# Patient Record
Sex: Male | Born: 1945
Health system: Southern US, Community
[De-identification: ages and names within clinical notes are randomized; demographics above are authoritative.]

## PROBLEM LIST (undated history)

## (undated) DIAGNOSIS — I10 Essential (primary) hypertension: Secondary | ICD-10-CM

## (undated) DIAGNOSIS — I2510 Atherosclerotic heart disease of native coronary artery without angina pectoris: Secondary | ICD-10-CM

## (undated) DIAGNOSIS — I4819 Other persistent atrial fibrillation: Secondary | ICD-10-CM

## (undated) DIAGNOSIS — N400 Enlarged prostate without lower urinary tract symptoms: Secondary | ICD-10-CM

## (undated) DIAGNOSIS — I4892 Unspecified atrial flutter: Secondary | ICD-10-CM

## (undated) DIAGNOSIS — G4730 Sleep apnea, unspecified: Secondary | ICD-10-CM

## (undated) DIAGNOSIS — K279 Peptic ulcer, site unspecified, unspecified as acute or chronic, without hemorrhage or perforation: Secondary | ICD-10-CM

## (undated) DIAGNOSIS — E7800 Pure hypercholesterolemia, unspecified: Secondary | ICD-10-CM

## (undated) HISTORY — DX: Peptic ulcer, site unspecified, unspecified as acute or chronic, without hemorrhage or perforation: K27.9

## (undated) HISTORY — DX: Benign prostatic hyperplasia without lower urinary tract symptoms: N40.0

## (undated) HISTORY — PX: APPENDECTOMY: SHX54

## (undated) HISTORY — DX: Sleep apnea, unspecified: G47.30

## (undated) HISTORY — DX: Pure hypercholesterolemia, unspecified: E78.00

## (undated) HISTORY — DX: Other persistent atrial fibrillation: I48.19

## (undated) HISTORY — DX: Atherosclerotic heart disease of native coronary artery without angina pectoris: I25.10

## (undated) HISTORY — DX: Unspecified atrial flutter: I48.92

## (undated) HISTORY — DX: Essential (primary) hypertension: I10

---

## 1998-07-04 HISTORY — PX: HERNIA REPAIR: SHX51

## 1999-08-31 ENCOUNTER — Ambulatory Visit (HOSPITAL_BASED_OUTPATIENT_CLINIC_OR_DEPARTMENT_OTHER): Admission: RE | Admit: 1999-08-31 | Discharge: 1999-09-01 | Payer: Self-pay | Admitting: Surgery

## 2003-10-29 DIAGNOSIS — C4492 Squamous cell carcinoma of skin, unspecified: Secondary | ICD-10-CM

## 2003-10-29 HISTORY — DX: Squamous cell carcinoma of skin, unspecified: C44.92

## 2004-09-23 ENCOUNTER — Emergency Department (HOSPITAL_COMMUNITY): Admission: EM | Admit: 2004-09-23 | Discharge: 2004-09-23 | Payer: Self-pay | Admitting: Family Medicine

## 2005-01-20 ENCOUNTER — Ambulatory Visit (HOSPITAL_BASED_OUTPATIENT_CLINIC_OR_DEPARTMENT_OTHER): Admission: RE | Admit: 2005-01-20 | Discharge: 2005-01-20 | Payer: Self-pay | Admitting: Family Medicine

## 2005-01-23 ENCOUNTER — Ambulatory Visit: Payer: Self-pay | Admitting: Internal Medicine

## 2006-05-12 ENCOUNTER — Inpatient Hospital Stay (HOSPITAL_COMMUNITY): Admission: AC | Admit: 2006-05-12 | Discharge: 2006-05-16 | Payer: Self-pay

## 2006-05-31 ENCOUNTER — Ambulatory Visit (HOSPITAL_COMMUNITY): Admission: RE | Admit: 2006-05-31 | Discharge: 2006-05-31 | Payer: Self-pay | Admitting: Neurosurgery

## 2006-07-07 ENCOUNTER — Ambulatory Visit (HOSPITAL_COMMUNITY): Admission: RE | Admit: 2006-07-07 | Discharge: 2006-07-07 | Payer: Self-pay | Admitting: Neurosurgery

## 2008-09-03 ENCOUNTER — Inpatient Hospital Stay (HOSPITAL_COMMUNITY): Admission: AD | Admit: 2008-09-03 | Discharge: 2008-09-04 | Payer: Self-pay | Admitting: Interventional Cardiology

## 2009-10-30 ENCOUNTER — Encounter: Payer: Self-pay | Admitting: Internal Medicine

## 2009-11-03 ENCOUNTER — Encounter: Payer: Self-pay | Admitting: Internal Medicine

## 2010-04-19 ENCOUNTER — Encounter: Payer: Self-pay | Admitting: Internal Medicine

## 2010-05-03 ENCOUNTER — Encounter: Payer: Self-pay | Admitting: Internal Medicine

## 2010-06-15 DIAGNOSIS — E782 Mixed hyperlipidemia: Secondary | ICD-10-CM

## 2010-06-15 DIAGNOSIS — I4892 Unspecified atrial flutter: Secondary | ICD-10-CM

## 2010-06-15 DIAGNOSIS — I2510 Atherosclerotic heart disease of native coronary artery without angina pectoris: Secondary | ICD-10-CM | POA: Insufficient documentation

## 2010-06-15 DIAGNOSIS — I10 Essential (primary) hypertension: Secondary | ICD-10-CM

## 2010-06-15 DIAGNOSIS — E785 Hyperlipidemia, unspecified: Secondary | ICD-10-CM

## 2010-06-16 ENCOUNTER — Ambulatory Visit: Payer: Self-pay | Admitting: Internal Medicine

## 2010-06-16 ENCOUNTER — Encounter: Payer: Self-pay | Admitting: Internal Medicine

## 2010-06-16 DIAGNOSIS — G4733 Obstructive sleep apnea (adult) (pediatric): Secondary | ICD-10-CM | POA: Insufficient documentation

## 2010-07-25 ENCOUNTER — Encounter: Payer: Self-pay | Admitting: Neurosurgery

## 2010-08-05 NOTE — Letter (Signed)
Summary: Arthur Gardner Physicians Office Visit Note   [facility] Physicians Office Visit Note   Imported By: Roderic Ovens 07/01/2010 13:48:43  _____________________________________________________________________  External Attachment:    Type:   Image     Comment:   External Document

## 2010-08-05 NOTE — Assessment & Plan Note (Signed)
Summary: nep/a-fib   Visit Type:  Initial Consult Referring Arthur Gardner:  Dr Eldridge Dace Primary Arthur Gardner:  Arthur Nora, MD   History of Present Illness: [name] is a pleasant 65 yo WM with a h/o CAD who presents for further evaluation of atrial arrhythmias.  He reports onset of palpitations 15 -20 years ago.  He describes irregular palpitations lasting 5 minutes to 8 hours.  Episodes occasional occur during the night.  He is unaware of triggers/ precipitants, though often stress will bring on an episode.  Episodes occur every 3-4 months.  He reports noticing a regular "heart pounding" which will sometimes wake him from sleeping.  He denies chest pain, SOB, dizziness, presyncope, or syncope.  Recently, he had a stress test during which atrial fibrillation and atrial flutter were captured.  He was placed on metoprolol as needed.  He has not had further episodes since that time.  Allergies (verified): 1)  ! Steroids  Past History:  Past Medical History:  1. Coronary artery disease status post drug-eluting stent to the left anterior descending.  2/10  2. Atrial flutter/ atrial fibrillation  3. History of peptic ulcer disease (remote).   4. Hypercholesterolemia     5. Hypertension in the past.   6. Benign prostatic hypertrophy.   7. Sleep apnea, not using CPAP.   8. Bilateral hearing loss, wears hearing aids.   9. Status post appendectomy.   10.Status post bilateral hernia repair in 2000.  Past Surgical History:  Status post appendectomy.   Status post bilateral hernia repair in 2000.  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   Vital Signs:  Patient profile:   65 year old male Height:      70 inches Weight:      191 pounds BMI:     27.50 Pulse rate:   59 / minute BP sitting:   104 / 80  (left  arm)  Vitals Entered By: Laurance Flatten CMA (June 16, 2010 9:04 AM)  Physical Exam  General:  Well developed, well nourished, in no acute distress. Head:  normocephalic and atraumatic Eyes:  PERRLA/EOM intact; conjunctiva and lids normal. Mouth:  Teeth, gums and palate normal. Oral mucosa normal. Neck:  Neck supple, no JVD. No masses, thyromegaly or abnormal cervical nodes. Lungs:  Clear bilaterally to auscultation and percussion. Heart:  Non-displaced PMI, chest non-tender; regular rate and rhythm, S1, S2 without murmurs, rubs or gallops. Carotid upstroke normal, no bruit. Normal abdominal aortic size, no bruits. Femorals normal pulses, no bruits. Pedals normal pulses. No edema, no varicosities. Abdomen:  Bowel sounds positive; abdomen soft and non-tender without masses, organomegaly, or hernias noted. No hepatosplenomegaly. Msk:  Back normal, normal gait. Muscle strength and tone normal. Pulses:  pulses normal in all 4 extremities Extremities:  No clubbing or cyanosis. Neurologic:  Alert and oriented x 3. Skin:  Intact without lesions or rashes. Psych:  Normal affect.   Nuclear Study  Procedure date:  11/03/2009  Findings:      Iimpression: The post stresss left ventricle function is normal. The post-stress EF is 57%. Exercise capacity 10.0 METS.  Superventricular arrhythmia noted during recovery. Possible atrial flutter. Will get EP oponion regarding strips. Normal myocardial perfusion scan demonstrating an attenuation atifact in the inferior region of the myocardium. No ischemia or infarct/scan is  seen in remaining myocardium.  Lance Muss, MD [facility]    EKG  Procedure date:  06/16/2010  Findings:      sinus bradycardia 59 bpm, PR 168, QRS 102, Qtc 400, LAD, otherwise normal ekg  Impression & Recommendations:  Problem # 1:  ATRIAL FLUTTER (ICD-427.32) [name] is a pleasant 65 yo WM with a h/o irregular palpitations who presents today for EP consultation regarding  atrial arrhythmia recently observed during an exercise stress test.  Though he reports longstanding palpitations, their infrequent nature has made clear documentation and characterization of his arrhythmias difficult. I have reviewed his recent GXT electrocardiograms in detail.  The tracings reveal initially sinus rhythm with exercise.  During stage 3, he begins having short salvos of afib.  The study is terminated and he develops sustained afib with RVR initially with recovery.  After 1 minute of recovery, atrial fibrillation organizes into atrial flutter (typical appearing) with 2:1 AV conduction.  He remains in atrial flutter for several minutes with intermittent 4:1 AV conduction.  Finally, at 12 minutes of recovery, he returns to sinus rhythm. This demonstrates, that his initial arrhythmia was atrial fibrillation with subsequent organization into typical flutter before spontaneous termination.  Therapeutic strategies for afib and atrial flutter including medicine and ablation were discussed in detail with the patient today. Risk, benefits, and alternatives to EP study and radiofrequency ablation were also discussed in detail today. It remains unclear as to whether the majority of his clinical symptoms are due to afib or atrial flutter.  At this point, I would recommend medical therapy.  Given his h/o coronary disease, I woud not recommend Ic medicines.  I would consider multaq as an initial antiarrhythmic medicine for this patient.  Presently, he states that episodes are infrequent and therefore declines daily antiarrhythmic medicine.  He will continue to take metoprolol for rate control.  His CHADs2 score is 1.  He prefers aspirin over coumadin at this time. Compliance with CPAP was encouraged. He will need an echocardiogram by [facility] to evaluate LA size and exclude valvular disease.  Problem # 2:  CAD (ICD-414.00) stable, without symptoms of ischemia no changes  Problem # 3:   HYPERTENSION, BENIGN (ICD-401.1) stable His updated medication list for this problem includes:    Lisinopril 5 Mg Tabs (Lisinopril) .Marland Kitchen... Take one tablet by mouth daily    Aspirin Ec 325 Mg Tbec (Aspirin) .Marland Kitchen... Take one tablet by mouth daily  Problem # 4:  MIXED HYPERLIPIDEMIA (ICD-272.2) stable His updated medication list for this problem includes:    Simvastatin 20 Mg Tabs (Simvastatin) .Marland Kitchen... Take one tablet by mouth daily at bedtime  Problem # 5:  OBSTRUCTIVE SLEEP APNEA (ICD-327.23) the importance of compliance with CPAP was advised.  Patient Instructions: 1)  Your physician recommends that you schedule a follow-up appointment in: 3 months with Dr Johney Frame 2)  Your physician recommends that you continue on your current medications as directed. Please refer to the Current Medication list given to you today.

## 2010-08-06 ENCOUNTER — Encounter: Payer: Self-pay | Admitting: Internal Medicine

## 2010-08-06 ENCOUNTER — Telehealth: Payer: Self-pay | Admitting: Internal Medicine

## 2010-08-11 NOTE — Progress Notes (Signed)
Summary: re ekg  Phone Note Call from Patient Call back at Home Phone [phone]   Caller: Patient Reason for Call: Talk to Nurse Summary of Call: pt states dr. Johney Frame told him to call you re an ekg. pt only wants to talk kelly.  Initial call taken by: Roe Coombs,  August 06, 2010 12:16 PM  Follow-up for Phone Call        called pt and he is coming in for EKG now Dennis Bast, RN, BSN  August 06, 2010 12:55 PM pt came in for EKG and he is in afib.  HR controlled with a HR of 88-96.  Ok to take an additional Metoprolol 25mg  at this time.  His normal resting HR is in the 50's.  I also explained to him that he could take Multaq as Dr Johney Frame had discussed with him  He is opposed to this as he wants to get off of some of his medications He is going to talk with his primary cardiologist regarding that.  Will continue his meds as directed for know and call us back if his symptoms get worse. Dennis Bast, RN, BSN  August 06, 2010 2:18 PM Follow-up by: Hillis Range, MD,  August 06, 2010 5:47 PM  Additional Follow-up for Phone Call Additional follow up Details #1::        agree Additional Follow-up by: Hillis Range, MD,  August 06, 2010 5:48 PM

## 2010-09-14 ENCOUNTER — Encounter: Payer: Self-pay | Admitting: Internal Medicine

## 2010-09-15 ENCOUNTER — Ambulatory Visit (INDEPENDENT_AMBULATORY_CARE_PROVIDER_SITE_OTHER): Payer: Medicare Other | Admitting: Internal Medicine

## 2010-09-15 ENCOUNTER — Encounter: Payer: Self-pay | Admitting: Internal Medicine

## 2010-09-15 DIAGNOSIS — I4891 Unspecified atrial fibrillation: Secondary | ICD-10-CM | POA: Insufficient documentation

## 2010-09-15 DIAGNOSIS — I4892 Unspecified atrial flutter: Secondary | ICD-10-CM

## 2010-09-15 DIAGNOSIS — I2510 Atherosclerotic heart disease of native coronary artery without angina pectoris: Secondary | ICD-10-CM

## 2010-09-21 NOTE — Assessment & Plan Note (Signed)
Summary: 3 month fu appt rs from bumplist/mt   Visit Type:  Follow-up Referring Provider:  Dr Eldridge Dace Primary Provider:  [name], MD   History of Present Illness: [name] is a pleasant 65 yo WM with a h/o CAD, atrial fibrillation, and atrial flutter who presents for further EP follow-up.  He reports doing well since last being seen in our office.  He is unaware of significant symptoms of arrhythmias.  In early february, he did have a day where he noticed palpitations with mild fatigue.  He presented to our office and was found to have rate controlled afib.  He declined antiarrhythmic medicine at that time.  He is unaware of any subsequent afib.  He is otherwise without complaint today.  He denies CP, SOB, dizziness, presyncope, or syncope.  Current Medications (verified): 1)  Plavix 75 Mg Tabs (Clopidogrel Bisulfate) .... Take One Tablet By Mouth Daily 2)  Lisinopril 5 Mg Tabs (Lisinopril) .... Take One Tablet By Mouth Daily 3)  Simvastatin 20 Mg Tabs (Simvastatin) .... Take One Tablet By Mouth Daily At Bedtime 4)  Metoprolol Tartrate 25 Mg Tabs (Metoprolol Tartrate) .... Take One Tablet By Mouth Once Daily 5)  Fish Oil   Oil (Fish Oil) .... Two Times A Day 6)  Co Q-10 .... Once Daily 7)  Glucosamine-Chondroitin   Caps (Glucosamine-Chondroit-Vit C-Mn) .... Two Times A Day 8)  Aspirin Ec 325 Mg Tbec (Aspirin) .... Take One Tablet By Mouth Daily 9)  Calcium-Magnesium-Zinc 333-133-5 Mg Tabs (Calcium-Magnesium-Zinc) .... Two Times A Day  Allergies: 1)  ! Steroids  Past History:  Past Medical History: Reviewed history from 06/16/2010 and no changes required.  1. Coronary artery disease status post drug-eluting stent to the left anterior descending.  2/10  2. Atrial flutter/ atrial fibrillation  3. History of peptic ulcer disease (remote).   4. Hypercholesterolemia     5. Hypertension in the past.   6. Benign prostatic hypertrophy.   7. Sleep apnea, not using CPAP.   8. Bilateral  hearing loss, wears hearing aids.   9. Status post appendectomy.   10.Status post bilateral hernia repair in 2000.  Past Surgical History: Reviewed history from 06/16/2010 and no changes required.  Status post appendectomy.   Status post bilateral hernia repair in 2000.  Social History: Reviewed history from 06/16/2010 and no changes required. 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   Vital Signs:  Patient profile:   65 year old male Height:      70 inches Weight:      195 pounds BMI:     28.08 Pulse rate:   56 / minute BP sitting:   110 / 72  (left arm)  Vitals Entered By: Laurance Flatten CMA (September 15, 2010 9:15 AM)  Physical Exam  General:  Well developed, well nourished, in no acute distress. Head:  normocephalic and atraumatic Eyes:  PERRLA/EOM intact; conjunctiva and lids normal. Mouth:  Teeth, gums and palate normal. Oral mucosa normal. Neck:  Neck supple, no JVD. No masses, thyromegaly or abnormal cervical nodes. Lungs:  Clear bilaterally to auscultation and percussion. Heart:  Non-displaced PMI, chest non-tender; regular rate and rhythm, S1, S2 without murmurs, rubs or gallops. Carotid upstroke normal, no bruit. Normal abdominal aortic size, no bruits. Femorals normal pulses, no bruits. Pedals normal pulses. No edema, no varicosities. Abdomen:  Bowel  sounds positive; abdomen soft and non-tender without masses, organomegaly, or hernias noted. No hepatosplenomegaly. Msk:  Back normal, normal gait. Muscle strength and tone normal. Extremities:  No clubbing or cyanosis. Neurologic:  Alert and oriented x 3.   EKG  Procedure date:  08/06/2010  Findings:      afib, V rates 80s  Impression & Recommendations:  Problem # 1:  ATRIAL FLUTTER (ICD-427.32) [name] is a pleasant 65 yo WM with a h/o irregular palpitations who presents today for EP  consultation regarding atrial arrhythmia recently observed during an exercise stress test.   He has been documented to have both afib and atrial flutter.  With metoprolol, his recent afib was well rate controlled.  He reports minimal sypmtoms with arrhythmias and is clear that he wishes to decline AAD at this time.  He will continue to take metoprolol for rate control.  His CHADs2 score is 1.  He prefers aspirin over coumadin at this time. Compliance with CPAP was encouraged. He will follow-up with Dr Eldridge Dace and I will see him as needed.  Problem # 2:  ATRIAL FIBRILLATION (ICD-427.31) as above  Problem # 3:  CORONARY ATHEROSCLEROSIS NATIVE CORONARY ARTERY (ICD-414.01) no ischemic sypmtoms no changes  Problem # 4:  HYPERTENSION, BENIGN (ICD-401.1) stable  Patient Instructions: 1)  Your physician recommends that you schedule a follow-up appointment as needed with Dr Johney Frame 2)  Your physician recommends that you continue on your current medications as directed. Please refer to the Current Medication list given to you today.

## 2010-10-14 LAB — CBC
MCHC: 35.2 g/dL (ref 30.0–36.0)
MCV: 90.4 fL (ref 78.0–100.0)
RDW: 12.5 % (ref 11.5–15.5)

## 2010-10-14 LAB — BASIC METABOLIC PANEL
BUN: 8 mg/dL (ref 6–23)
CO2: 29 mEq/L (ref 19–32)
Calcium: 8.7 mg/dL (ref 8.4–10.5)
Chloride: 104 mEq/L (ref 96–112)
Creatinine, Ser: 0.88 mg/dL (ref 0.4–1.5)
Glucose, Bld: 107 mg/dL — ABNORMAL HIGH (ref 70–99)

## 2010-11-16 NOTE — Discharge Summary (Signed)
Arthur Gardner, Arthur Gardner                   ACCOUNT NO.:  [id number]   MEDICAL RECORD NO.:  [id number]          PATIENT TYPE:  INP   LOCATION:  2502                         FACILITY:  [facility]   PHYSICIAN:  Corky Crafts, MDDATE OF BIRTH:  1946/04/30   DATE OF ADMISSION:  09/03/2008  DATE OF DISCHARGE:                               DISCHARGE SUMMARY   DISCHARGE DIAGNOSES:  1. Coronary artery disease status post drug-eluting stent to the left      anterior descending.  2. Chest pain, resolved.  3. History of peptic ulcer disease.  4. Hypercholesterolemia on over-the-counter medications for treatment.  5. History of hypertension in the past.  6. Benign prostatic hypertrophy.  7. Sleep apnea, not using CPAP.  8. Bilateral hearing loss, wears hearing aids.  9. Status post appendectomy.  10.Status post bilateral hernia repair in 2000.   HOSPITAL COURSE:  Arthur Gardner is a 65 year old patient who is referred by  Dr. Johny Blamer for complaints of intermittent chest pain.  Dr.  Eldridge Dace saw the patient and felt that his symptoms were worrisome for  underlying heart disease.  His EKG showed septal Q-waves with minimal ST  segment elevation, ST-T wave changes in lead III, T-wave inversion in  lead IV.  This EKG was markedly changed from October 2009.  For this  reason, he felt cardiac catheterization was warranted.   On September 03, 2008, the patient was brought into the hospital for left  heart catheterization.  He was found to have a 99% proximal LAD lesion  with TIMI 2 flow.  No other real blockages.  LV gram showed anterior  apical hypokinesis with an EF of 35%-40%.  An Endeavor stent was placed  to the proximal LAD lesion with good result.  The patient is to remain  on aspirin and Plavix indefinitely.  On discharge date, his labs showed  hemoglobin of 14.7, hematocrit 41.8, white count 6.4, platelets 136,  sodium 137, potassium 4.5, BUN 8, and creatinine 0.88.   He was felt to be ready for  discharge.  He was discharged to home in  stable and improved condition.   DISCHARGE MEDICATIONS:  1. Plavix 75 mg a day.  2. Enteric-coated aspirin 325 mg a day.  3. Osteo Bi-Flex as directed.  4. Fish oil as before.  5. Saw palmetto as directed.  6. Red yeast rice as directed.  7. Calcium daily.  8. Sublingual nitroglycerin p.r.n., chest pain.   The patient is to remain on a low-sodium heart-healthy diet.  Clean cath  site gently with soap and water, no scrubbing.  Increase activity  slowly.  No lifting over 10 pounds for 1 week, no driving for 2 days.  Follow up with Dr. Eldridge Dace on September 19, 2008, at 11:30 a.m.      Guy Franco, P.A.      Corky Crafts, MD  Electronically Signed    LB/MEDQ  D:  09/04/2008  T:  09/04/2008  Job:  [id number]   cc:   Melida Quitter, M.D.

## 2010-11-19 NOTE — Procedures (Signed)
Arthur Gardner, Arthur Gardner                   ACCOUNT NO.:  [id number]   MEDICAL RECORD NO.:  [id number]          PATIENT TYPE:  OUT   LOCATION:  SLEEP CENTER                 FACILITY[facility]  [facility]   PHYSICIAN:  Clinton D. Maple Hudson, M.D. DATE OF BIRTH:  [birth year]   DATE OF STUDY:  01/20/2005                              NOCTURNAL POLYSOMNOGRAM   REFERRING PHYSICIAN:  Noberto Retort, M.D.   INDICATION FOR STUDY:  Hypersomnia with sleep apnea.   EPWORTH SLEEPINESS SCORE:  4/24   BMI:  25   WEIGHT:  175 pounds   SLEEP ARCHITECTURE:  Total sleep time 369 minutes with sleep efficiency 82%.  Stage I 8%, stage III 57%, stages III and IV 17%.  REM 18% of total sleep  time.  Sleep latency 18 minutes.  REM latency 85 minutes.  Awake after sleep  onset 65 minutes.  Arousal index 17.  No bedtime medications taken.   RESPIRATORY DATA:  Split study protocol.  Respiratory disturbance index  (RDI, AHI) 38.5 obstructive events per hour indicating moderate obstructive  sleep apnea/hypopnea syndrome before CPAP.  There were 65 obstructive apneas  and 20 hypopneas before CPAP.  Most events were recorded while sleeping  supine.  REM RDI 12.5.  CPAP was titrated to 8 CWP, RDI 8.4 per hour, using  a small ResMed full face mask.   OXYGEN DATA:  Moderate snoring with oxygen desaturations to a nadir of 55%  before CPAP.  After CPAP control, oxygen saturation held 94-98% on room air.   CARDIAC DATA:  Normal sinus rhythm.   MOVEMENT/PARASOMNIA:  Occasional leg jerks with little sleep disturbance.   IMPRESSION/RECOMMENDATION:  1.  Moderate obstructive sleep apnea/hypopnea syndrome, respiratory      disturbance index  38.5 per hour with moderate snoring and oxygen      desaturation to 55%.  2.  Successful CPAP titration to 8 CWP using a small ResMed full face mask      for an respiratory disturbance index      of 8.4.  Consider home trial at 9 CWP in an effort to reduce the      respiratory disturbance index into the  desired range of 0-5 per hour.      Clinton D. Maple Hudson, M.D.  Diplomat    CDY/MEDQ  D:  01/23/2005 13:33:17  T:  01/24/2005 09:30:45  Job:  [id number]

## 2010-11-19 NOTE — Op Note (Signed)
[place]  Patient:    Arthur Gardner, Arthur Gardner                          MRN: [id number] Adm. Date:  [id number] Attending:  Katha Cabal CC:         Willis Modena. Dreiling, M.D.                           Operative Report  CCS NUMBER:  [id number]  PREOPERATIVE DIAGNOSIS:  Bilateral inguinal hernias.  POSTOPERATIVE DIAGNOSIS:  Bilateral direct inguinal hernias with the right side  larger than the left.  PROCEDURE:  Laparoscopic bilateral inguinal herniorrhaphy.  SURGEON:  Thornton Park. Daphine Deutscher, M.D.  ASSISTANT:  Sharlet Salina T. Hoxworth, M.D.  ANESTHESIA:  General endotracheal.  DESCRIPTION OF PROCEDURE:  The patient was taken to OR #3 on February 27 and given general anesthesia.  He received Ancef, 1 g.  After I clipped him, I noted a little pimple in his left lower quadrant in the inguinal region and, after prepping with Betadine, I put a little Tegaderm to isolate that area.  This was not going to e in our way.  He was receiving Ancef preoperatively.  A longitudinal incision was made down below his umbilicus and I slipped off to the right side of the midline, where I incised the rectus sheath and then did a blunt dissection with my finger and then inserted a balloon.  Balloon dissection was performed with the scope in place and got a good dissection.  The patient had a previous appendectomy incision. Nevertheless, I got a good dissection with the balloon.  It was apparent that he had bilateral direct hernias.  I went ahead, though, and dissected free the right cord and the left cord and looked at these and saw no indirect component.  The large right direct hernia was noted and had all the fat taken out of it.  The left side was similarly noted.  I cut a piece of mesh 4 x 6 from a piece of 6 x 6 mesh and placed it on the right side first, taking it along Coopers ligament down with multiple staples down out to near the femoral vein.  I then tacked  anteriorly and all around the defect and then tacked it laterally to hold it in place.  I did he same thing on the left side.  They did overlap slightly.  I could always palpate the hernia tacker anteriorly and did not tack below the inguinal ligament.  No bleeding was seen.  I then deflated the abdomen and removed the trocars. Then, 4-0 Vicryl was used subcuticularly and the skin was closed with benzoin and Steri-Strips.  The patient will be given Mepergan Fortis to take for pain at home and will be given Keflex to take twice a day for five days.  However, prior to discharge, he is going to spend overnight for observation at [facility]. DD:  08/31/99 TD:  08/31/99 Job: [id number]

## 2010-12-13 ENCOUNTER — Encounter: Payer: Self-pay | Admitting: Internal Medicine

## 2011-07-11 DIAGNOSIS — G4737 Central sleep apnea in conditions classified elsewhere: Secondary | ICD-10-CM | POA: Diagnosis not present

## 2011-07-11 DIAGNOSIS — G4733 Obstructive sleep apnea (adult) (pediatric): Secondary | ICD-10-CM | POA: Diagnosis not present

## 2011-11-14 DIAGNOSIS — I10 Essential (primary) hypertension: Secondary | ICD-10-CM | POA: Diagnosis not present

## 2011-11-14 DIAGNOSIS — E782 Mixed hyperlipidemia: Secondary | ICD-10-CM | POA: Diagnosis not present

## 2011-11-14 DIAGNOSIS — Z125 Encounter for screening for malignant neoplasm of prostate: Secondary | ICD-10-CM | POA: Diagnosis not present

## 2011-11-14 DIAGNOSIS — I2510 Atherosclerotic heart disease of native coronary artery without angina pectoris: Secondary | ICD-10-CM | POA: Diagnosis not present

## 2011-11-21 DIAGNOSIS — H40019 Open angle with borderline findings, low risk, unspecified eye: Secondary | ICD-10-CM | POA: Diagnosis not present

## 2011-11-21 DIAGNOSIS — H11009 Unspecified pterygium of unspecified eye: Secondary | ICD-10-CM | POA: Diagnosis not present

## 2011-11-21 DIAGNOSIS — H43399 Other vitreous opacities, unspecified eye: Secondary | ICD-10-CM | POA: Diagnosis not present

## 2011-11-21 DIAGNOSIS — H04129 Dry eye syndrome of unspecified lacrimal gland: Secondary | ICD-10-CM | POA: Diagnosis not present

## 2011-11-21 DIAGNOSIS — H2510 Age-related nuclear cataract, unspecified eye: Secondary | ICD-10-CM | POA: Diagnosis not present

## 2011-12-13 DIAGNOSIS — H11009 Unspecified pterygium of unspecified eye: Secondary | ICD-10-CM | POA: Diagnosis not present

## 2011-12-13 DIAGNOSIS — H01009 Unspecified blepharitis unspecified eye, unspecified eyelid: Secondary | ICD-10-CM | POA: Diagnosis not present

## 2011-12-13 DIAGNOSIS — L719 Rosacea, unspecified: Secondary | ICD-10-CM | POA: Diagnosis not present

## 2012-07-24 DIAGNOSIS — G4733 Obstructive sleep apnea (adult) (pediatric): Secondary | ICD-10-CM | POA: Diagnosis not present

## 2012-08-14 DIAGNOSIS — D0439 Carcinoma in situ of skin of other parts of face: Secondary | ICD-10-CM | POA: Diagnosis not present

## 2012-08-14 DIAGNOSIS — L570 Actinic keratosis: Secondary | ICD-10-CM | POA: Diagnosis not present

## 2012-08-14 DIAGNOSIS — D485 Neoplasm of uncertain behavior of skin: Secondary | ICD-10-CM | POA: Diagnosis not present

## 2012-09-06 DIAGNOSIS — D0439 Carcinoma in situ of skin of other parts of face: Secondary | ICD-10-CM | POA: Diagnosis not present

## 2012-09-06 DIAGNOSIS — L570 Actinic keratosis: Secondary | ICD-10-CM | POA: Diagnosis not present

## 2012-11-30 DIAGNOSIS — E782 Mixed hyperlipidemia: Secondary | ICD-10-CM | POA: Diagnosis not present

## 2012-11-30 DIAGNOSIS — I059 Rheumatic mitral valve disease, unspecified: Secondary | ICD-10-CM | POA: Diagnosis not present

## 2012-11-30 DIAGNOSIS — I10 Essential (primary) hypertension: Secondary | ICD-10-CM | POA: Diagnosis not present

## 2012-11-30 DIAGNOSIS — I2510 Atherosclerotic heart disease of native coronary artery without angina pectoris: Secondary | ICD-10-CM | POA: Diagnosis not present

## 2012-12-17 DIAGNOSIS — Z0000 Encounter for general adult medical examination without abnormal findings: Secondary | ICD-10-CM | POA: Diagnosis not present

## 2012-12-17 DIAGNOSIS — E785 Hyperlipidemia, unspecified: Secondary | ICD-10-CM | POA: Diagnosis not present

## 2012-12-17 DIAGNOSIS — Z125 Encounter for screening for malignant neoplasm of prostate: Secondary | ICD-10-CM | POA: Diagnosis not present

## 2012-12-17 DIAGNOSIS — E875 Hyperkalemia: Secondary | ICD-10-CM | POA: Diagnosis not present

## 2012-12-17 DIAGNOSIS — I10 Essential (primary) hypertension: Secondary | ICD-10-CM | POA: Diagnosis not present

## 2012-12-21 DIAGNOSIS — I059 Rheumatic mitral valve disease, unspecified: Secondary | ICD-10-CM | POA: Diagnosis not present

## 2012-12-31 DIAGNOSIS — H2510 Age-related nuclear cataract, unspecified eye: Secondary | ICD-10-CM | POA: Diagnosis not present

## 2012-12-31 DIAGNOSIS — H40019 Open angle with borderline findings, low risk, unspecified eye: Secondary | ICD-10-CM | POA: Diagnosis not present

## 2012-12-31 DIAGNOSIS — H04129 Dry eye syndrome of unspecified lacrimal gland: Secondary | ICD-10-CM | POA: Diagnosis not present

## 2013-01-15 DIAGNOSIS — L570 Actinic keratosis: Secondary | ICD-10-CM | POA: Diagnosis not present

## 2013-03-11 DIAGNOSIS — Z79899 Other long term (current) drug therapy: Secondary | ICD-10-CM | POA: Diagnosis not present

## 2013-03-11 DIAGNOSIS — E7800 Pure hypercholesterolemia, unspecified: Secondary | ICD-10-CM | POA: Diagnosis not present

## 2013-05-24 DIAGNOSIS — H2510 Age-related nuclear cataract, unspecified eye: Secondary | ICD-10-CM | POA: Diagnosis not present

## 2013-05-24 DIAGNOSIS — H11009 Unspecified pterygium of unspecified eye: Secondary | ICD-10-CM | POA: Diagnosis not present

## 2013-05-24 DIAGNOSIS — L719 Rosacea, unspecified: Secondary | ICD-10-CM | POA: Diagnosis not present

## 2013-05-24 DIAGNOSIS — H179 Unspecified corneal scar and opacity: Secondary | ICD-10-CM | POA: Diagnosis not present

## 2013-05-24 DIAGNOSIS — H40019 Open angle with borderline findings, low risk, unspecified eye: Secondary | ICD-10-CM | POA: Diagnosis not present

## 2013-06-18 DIAGNOSIS — L723 Sebaceous cyst: Secondary | ICD-10-CM | POA: Diagnosis not present

## 2013-07-29 DIAGNOSIS — G4733 Obstructive sleep apnea (adult) (pediatric): Secondary | ICD-10-CM | POA: Diagnosis not present

## 2013-08-02 DIAGNOSIS — L570 Actinic keratosis: Secondary | ICD-10-CM | POA: Diagnosis not present

## 2013-08-02 DIAGNOSIS — D0439 Carcinoma in situ of skin of other parts of face: Secondary | ICD-10-CM | POA: Diagnosis not present

## 2013-08-02 DIAGNOSIS — D0430 Carcinoma in situ of skin of unspecified part of face: Secondary | ICD-10-CM | POA: Diagnosis not present

## 2013-08-14 ENCOUNTER — Other Ambulatory Visit: Payer: Self-pay | Admitting: Interventional Cardiology

## 2013-08-26 DIAGNOSIS — B029 Zoster without complications: Secondary | ICD-10-CM | POA: Diagnosis not present

## 2013-09-27 ENCOUNTER — Encounter: Payer: Self-pay | Admitting: Interventional Cardiology

## 2013-09-30 DIAGNOSIS — L570 Actinic keratosis: Secondary | ICD-10-CM | POA: Diagnosis not present

## 2013-10-29 DIAGNOSIS — C4442 Squamous cell carcinoma of skin of scalp and neck: Secondary | ICD-10-CM | POA: Diagnosis not present

## 2013-10-29 DIAGNOSIS — L570 Actinic keratosis: Secondary | ICD-10-CM | POA: Diagnosis not present

## 2013-10-29 DIAGNOSIS — D485 Neoplasm of uncertain behavior of skin: Secondary | ICD-10-CM | POA: Diagnosis not present

## 2013-11-12 ENCOUNTER — Ambulatory Visit: Payer: Medicare Other | Admitting: Interventional Cardiology

## 2013-11-29 ENCOUNTER — Ambulatory Visit: Payer: Medicare Other | Admitting: Interventional Cardiology

## 2013-12-04 ENCOUNTER — Ambulatory Visit (INDEPENDENT_AMBULATORY_CARE_PROVIDER_SITE_OTHER): Payer: Medicare Other | Admitting: Interventional Cardiology

## 2013-12-04 ENCOUNTER — Encounter: Payer: Self-pay | Admitting: Interventional Cardiology

## 2013-12-04 VITALS — BP 120/78 | HR 45 | Ht 70.0 in | Wt 204.0 lb

## 2013-12-04 DIAGNOSIS — E785 Hyperlipidemia, unspecified: Secondary | ICD-10-CM

## 2013-12-04 DIAGNOSIS — I4891 Unspecified atrial fibrillation: Secondary | ICD-10-CM

## 2013-12-04 NOTE — Progress Notes (Signed)
Patient ID: Arthur Gardner, male DOB: 08-27-1945, 68 y.o. MRN: [id number]   [place], [address]  [place], [place] [address]  Phone: [phone]  Fax: [phone]  Date: 12/04/2013  ID: Arthur Gardner, DOB 1946/02/12, MRN [id number]  PCP: No primary provider on file.  History of Present Illness:  Arthur Gardner is a 68 y.o. male who has had a DES to the LAD. He feels that he is gaining weight, metabolism is slowing. He walks daily. BP checked at home. CAD/ASCVD:  He does yardwork. He works 2 days a week.; walks 3x/day. c/o Leg edema more at the end of the day, mild.  Denies : Chest pain.  Dizziness.  Dyspnea on exertion.  Nitroglycerin.  Orthopnea.  Palpitations much bettter with metoprolol..  Paroxysmal nocturnal dyspnea.  Syncope.  Feels well over the past year. Denies dizziness, chest pain, SHOB, orthopnea, PND, and LE edema. No bleeding. Walks 3x daily for about 26min, 29mile each time w/o ShOB, chest pain. Still eats things he shouldn't eat. Wants to go back to 20mg  of simvastatin instead of of 40mg  PO QD. BP is well-controlled at home; SBP around 105/65.  He retired in 4/15.  Wt Readings from Last 3 Encounters:   12/04/13  204 lb (92.534 kg)   09/15/10  195 lb (88.451 kg)   06/16/10  191 lb (86.637 kg)    Past Medical History   Diagnosis  Date   .  CAD (coronary artery disease)      status post drug-eluting stent to the left anterior descending. 2/10   .  Atrial flutter    .  Peptic ulcer      (remote).   .  Hypercholesteremia    .  HTN (hypertension)    .  BPH (benign prostatic hyperplasia)    .  Hearing loss      wears hearing aids   .  Sleep apnea     Current Outpatient Prescriptions   Medication  Sig  Dispense  Refill   .  aspirin 81 MG tablet  Take 81 mg by mouth daily.     .  Coenzyme Q10 (COQ-10 PO)  200 mg daily. 1 tab po qd     .  Fish Oil OIL  2,000 mg 2 (two) times daily. bid     .  lisinopril (PRINIVIL,ZESTRIL) 5 MG tablet  Take 5 mg by mouth daily.     .   metoprolol succinate (TOPROL-XL) 25 MG 24 hr tablet  prn     .  simvastatin (ZOCOR) 40 MG tablet  TAKE 1 TABLET BY MOUTH EVERY DAY  30 tablet  7    No current facility-administered medications for this visit.    Allergies:  Allergies   Allergen  Reactions   .  Other      Steroids eye drops    Social History: The patient reports that he has quit smoking. He does not have any smokeless tobacco history on file. He reports that he does not use illicit drugs.  Family History: The patient's family history includes Heart disease in his father.  ROS: Please see the history of present illness. No nausea, vomiting. No fevers, chills. No focal weakness. No dysuria. All other systems reviewed and negative.  PHYSICAL EXAM:  VS: BP 120/78  Pulse 45  Ht 5\' 10"  (1.778 m)  Wt 204 lb (92.534 kg)  BMI 29.27 kg/m2  Well nourished, well developed, in no acute distress  HEENT: normal  Neck: no JVD , no carotid bruits  Cardiac: normal S1, S2; RRR;  Lungs: clear to auscultation bilaterally, no wheezing, rhonchi or rales  Abd: soft, nontender, no hepatomegaly  Ext: no edema  Skin: warm and dry  Neuro: no focal abnormalities noted  EKG: Sinus bradycardia; no ST segment changes  ASSESSMENT AND PLAN:  CAD in native artery  Continue Aspirin Tablet Delayed Release, 81 MG, 1 tablet, Orally, Once a day Stopped Plavix Tablet, 75 MG, 1 tablet, Orally, Once a day in 2014 Refill Nitroglycerin SL tablet, 0.4 mg, as directed, 25, Refills 6 LAB: Comp Metabolic Panel LAB: Lipid Panel w/Direct LDL Notes: No angina. 4 years post DES. Stopped Plavix. Endeavor stent in 2010.  2. Combined hyperlipidemia  Continue Simvastatin Tablet, 20.0 Milligram, TAKE 1 TABLET BY MOUTH EVERY EVENING Notes: LDL 99 in 5/13.  3. Benign hypertension  Continue Lisinopril Tablet, 5.0 Milligram, TAKE 1 TABLET BY MOUTH EVERY DAY Continue Metoprolol Tartrate Tablet, 25 Milligram, 1 tablet, Orally, once a day Notes: Controlled at home.  4.  Mitral valve disorders  Notes: Prior MVP with eccentric MR in 2012. Mild fatigue at times now. Checked LV funtion in 2014: normal LV function; mild eccentric MR, posteriorly directed ( anterior prolapse).  5. Atrial flutter: occurred at the time of a stress test. Controlled with metoprolol. Evaluated by Dr. Rayann Heman in the past. Sx much less frequent with metoprolol. If sx Get more frequent. WOuld have to consider monitor and discuss anticoagulation.  Signed,  Mina Marble, MD, [facility]  12/04/2013 3:01 PM

## 2013-12-04 NOTE — Patient Instructions (Signed)
Your physician recommends that you continue on your current medications as directed. Please refer to the Current Medication list given to you today.  Your physician recommends that you return for a FASTING lipid profile and cmet on 03/17/14.  Your physician wants you to follow-up in: 1 year with Dr. Irish Lack. You will receive a reminder letter in the mail two months in advance. If you don't receive a letter, please call our office to schedule the follow-up appointment.

## 2013-12-04 NOTE — Progress Notes (Signed)
Patient ID: Arthur Gardner, male DOB: 08/17/1945, 67 y.o. MRN: [id number]   [address]  [place], [place] [address]  Phone: [phone]  Fax: [phone]  Date: 12/04/2013  ID: Arthur Gardner, DOB 11/24/1945, MRN [id number]  PCP: No primary provider on file.  History of Present Illness:  Arthur Gardner is a 67 y.o. male who has had a DES to the LAD. He feels that he is gaining weight, metabolism is slowing. He walks daily. BP checked at home. CAD/ASCVD:  He does yardwork. He works 2 days a week.; walks 3x/day. c/o Leg edema more at the end of the day, mild.  Denies : Chest pain.  Dizziness.  Dyspnea on exertion.  Nitroglycerin.  Orthopnea.  Palpitations much bettter with metoprolol..  Paroxysmal nocturnal dyspnea.  Syncope.  Feels well over the past year. Denies dizziness, chest pain, SHOB, orthopnea, PND, and LE edema. No bleeding. Walks 3x daily for about 20min, 1mile each time w/o ShOB, chest pain. Still eats things he shouldn't eat. Wants to go back to 20mg of simvastatin instead of of 40mg PO QD. BP is well-controlled at home; SBP around 105/65.  He retired in 4/15.  Wt Readings from Last 3 Encounters:   12/04/13  204 lb (92.534 kg)   09/15/10  195 lb (88.451 kg)   06/16/10  191 lb (86.637 kg)    Past Medical History   Diagnosis  Date   .  CAD (coronary artery disease)      status post drug-eluting stent to the left anterior descending. 2/10   .  Atrial flutter    .  Peptic ulcer      (remote).   .  Hypercholesteremia    .  HTN (hypertension)    .  BPH (benign prostatic hyperplasia)    .  Hearing loss      wears hearing aids   .  Sleep apnea     Current Outpatient Prescriptions   Medication  Sig  Dispense  Refill   .  aspirin 81 MG tablet  Take 81 mg by mouth daily.     .  Coenzyme Q10 (COQ-10 PO)  200 mg daily. 1 tab po qd     .  Fish Oil OIL  2,000 mg 2 (two) times daily. bid     .  lisinopril (PRINIVIL,ZESTRIL) 5 MG tablet  Take 5 mg by mouth daily.     .   metoprolol succinate (TOPROL-XL) 25 MG 24 hr tablet  prn     .  simvastatin (ZOCOR) 40 MG tablet  TAKE 1 TABLET BY MOUTH EVERY DAY  30 tablet  7    No current facility-administered medications for this visit.    Allergies:  Allergies   Allergen  Reactions   .  Other      Steroids eye drops    Social History: The patient reports that he has quit smoking. He does not have any smokeless tobacco history on file. He reports that he does not use illicit drugs.  Family History: The patient's family history includes Heart disease in his father.  ROS: Please see the history of present illness. No nausea, vomiting. No fevers, chills. No focal weakness. No dysuria. All other systems reviewed and negative.  PHYSICAL EXAM:  VS: BP 120/78  Pulse 45  Ht 5' 10" (1.778 m)  Wt 204 lb (92.534 kg)  BMI 29.27 kg/m2  Well nourished, well developed, in no acute distress  HEENT: normal    Lungs:  clear to auscultation bilaterally, no wheezing, rhonchi or rales Abd: soft, nontender, no hepatomegaly Ext: no edema Skin: warm and dry Neuro:   no focal abnormalities noted  EKG:  Sinus bradycardia; no ST segment changes   ASSESSMENT AND PLAN:  CAD in native artery  Continue Aspirin Tablet Delayed Release, 81 MG, 1 tablet, Orally, Once a day Stopped Plavix Tablet, 75 MG, 1 tablet, Orally, Once a day in 2014 Refill Nitroglycerin SL tablet, 0.4 mg, as directed, 25, Refills 6 LAB: Comp Metabolic Panel LAB: Lipid Panel w/Direct LDL Notes: No angina. 4 years post DES. Stopped Plavix. Endeavor stent in 2010.  2. Combined hyperlipidemia  Continue Simvastatin Tablet, 20.0 Milligram, TAKE 1 TABLET BY MOUTH EVERY EVENING Notes: LDL 99 in 5/13.  3. Benign hypertension  Continue Lisinopril Tablet, 5.0 Milligram, TAKE 1 TABLET BY MOUTH EVERY DAY Continue Metoprolol Tartrate Tablet, 25 Milligram, 1 tablet, Orally, once a day Notes: Controlled  at home.  4. Mitral valve disorders  Notes: Prior MVP with eccentric MR in 2012. Mild fatigue at times now. Checked LV funtion in 2014: normal LV function; mild eccentric MR, posteriorly directed ( anterior prolapse).  5.  Atrial flutter: occurred at the time of a stress test.  Controlled with metoprolol.  Evaluated by Dr. Rayann Heman in the past.  Sx much less frequent with metoprolol.  If sx  Get more frequent.  WOuld have to consider monitor and discuss anticoagulation.     Signed, Mina Marble, MD, [facility] 12/04/2013 3:01 PM

## 2013-12-11 ENCOUNTER — Encounter: Payer: Self-pay | Admitting: Interventional Cardiology

## 2013-12-25 ENCOUNTER — Encounter: Payer: Self-pay | Admitting: Interventional Cardiology

## 2013-12-25 DIAGNOSIS — K279 Peptic ulcer, site unspecified, unspecified as acute or chronic, without hemorrhage or perforation: Secondary | ICD-10-CM | POA: Insufficient documentation

## 2014-01-06 DIAGNOSIS — H11009 Unspecified pterygium of unspecified eye: Secondary | ICD-10-CM | POA: Diagnosis not present

## 2014-01-06 DIAGNOSIS — H40019 Open angle with borderline findings, low risk, unspecified eye: Secondary | ICD-10-CM | POA: Diagnosis not present

## 2014-01-06 DIAGNOSIS — H2510 Age-related nuclear cataract, unspecified eye: Secondary | ICD-10-CM | POA: Diagnosis not present

## 2014-01-06 DIAGNOSIS — H524 Presbyopia: Secondary | ICD-10-CM | POA: Diagnosis not present

## 2014-01-28 DIAGNOSIS — L570 Actinic keratosis: Secondary | ICD-10-CM | POA: Diagnosis not present

## 2014-01-28 DIAGNOSIS — D239 Other benign neoplasm of skin, unspecified: Secondary | ICD-10-CM | POA: Diagnosis not present

## 2014-03-12 ENCOUNTER — Other Ambulatory Visit: Payer: Self-pay | Admitting: Interventional Cardiology

## 2014-03-14 ENCOUNTER — Other Ambulatory Visit: Payer: Self-pay | Admitting: Interventional Cardiology

## 2014-03-14 NOTE — Telephone Encounter (Signed)
Which form of metoprolol is this patient to be taking? Please advise. Thanks, MI

## 2014-03-14 NOTE — Telephone Encounter (Signed)
Checked [facility] and Metoprolol Tartrate is the correct medication. Please refill.

## 2014-03-17 ENCOUNTER — Other Ambulatory Visit (INDEPENDENT_AMBULATORY_CARE_PROVIDER_SITE_OTHER): Payer: Medicare Other

## 2014-03-17 DIAGNOSIS — E785 Hyperlipidemia, unspecified: Secondary | ICD-10-CM

## 2014-03-17 LAB — COMPREHENSIVE METABOLIC PANEL
ALT: 21 U/L (ref 0–53)
AST: 27 U/L (ref 0–37)
Albumin: 3.8 g/dL (ref 3.5–5.2)
Alkaline Phosphatase: 50 U/L (ref 39–117)
BILIRUBIN TOTAL: 1.2 mg/dL (ref 0.2–1.2)
BUN: 16 mg/dL (ref 6–23)
CO2: 25 meq/L (ref 19–32)
CREATININE: 1.1 mg/dL (ref 0.4–1.5)
Calcium: 9 mg/dL (ref 8.4–10.5)
Chloride: 104 mEq/L (ref 96–112)
GFR: 71.88 mL/min (ref >60.00)
Glucose, Bld: 97 mg/dL (ref 70–99)
Potassium: 4.5 mEq/L (ref 3.5–5.1)
Sodium: 138 mEq/L (ref 135–145)
Total Protein: 7.6 g/dL (ref 6.0–8.3)

## 2014-03-17 LAB — LIPID PANEL
CHOL/HDL RATIO: 4
Cholesterol: 138 mg/dL (ref 0–200)
HDL: 32.2 mg/dL — AB (ref >39.00)
LDL Cholesterol: 88 mg/dL (ref 0–99)
NONHDL: 105.8
Triglycerides: 87 mg/dL (ref 0.0–149.0)
VLDL: 17.4 mg/dL (ref 0.0–40.0)

## 2014-03-25 ENCOUNTER — Other Ambulatory Visit: Payer: Self-pay | Admitting: Cardiology

## 2014-03-25 DIAGNOSIS — E785 Hyperlipidemia, unspecified: Secondary | ICD-10-CM

## 2014-05-08 DIAGNOSIS — L718 Other rosacea: Secondary | ICD-10-CM | POA: Diagnosis not present

## 2014-05-08 DIAGNOSIS — H40013 Open angle with borderline findings, low risk, bilateral: Secondary | ICD-10-CM | POA: Diagnosis not present

## 2014-06-03 DIAGNOSIS — I10 Essential (primary) hypertension: Secondary | ICD-10-CM | POA: Diagnosis not present

## 2014-06-03 DIAGNOSIS — N400 Enlarged prostate without lower urinary tract symptoms: Secondary | ICD-10-CM | POA: Diagnosis not present

## 2014-06-03 DIAGNOSIS — Z125 Encounter for screening for malignant neoplasm of prostate: Secondary | ICD-10-CM | POA: Diagnosis not present

## 2014-06-03 DIAGNOSIS — I2510 Atherosclerotic heart disease of native coronary artery without angina pectoris: Secondary | ICD-10-CM | POA: Diagnosis not present

## 2014-06-03 DIAGNOSIS — J449 Chronic obstructive pulmonary disease, unspecified: Secondary | ICD-10-CM | POA: Diagnosis not present

## 2014-06-03 DIAGNOSIS — G4733 Obstructive sleep apnea (adult) (pediatric): Secondary | ICD-10-CM | POA: Diagnosis not present

## 2014-06-03 DIAGNOSIS — E780 Pure hypercholesterolemia: Secondary | ICD-10-CM | POA: Diagnosis not present

## 2014-06-03 DIAGNOSIS — Z0000 Encounter for general adult medical examination without abnormal findings: Secondary | ICD-10-CM | POA: Diagnosis not present

## 2014-06-15 ENCOUNTER — Other Ambulatory Visit: Payer: Self-pay | Admitting: Interventional Cardiology

## 2014-06-16 ENCOUNTER — Other Ambulatory Visit: Payer: Self-pay | Admitting: Interventional Cardiology

## 2014-07-29 ENCOUNTER — Other Ambulatory Visit: Payer: Self-pay | Admitting: Interventional Cardiology

## 2014-07-30 DIAGNOSIS — G4733 Obstructive sleep apnea (adult) (pediatric): Secondary | ICD-10-CM | POA: Diagnosis not present

## 2014-09-16 ENCOUNTER — Other Ambulatory Visit: Payer: Self-pay | Admitting: Interventional Cardiology

## 2014-10-22 DIAGNOSIS — C44320 Squamous cell carcinoma of skin of unspecified parts of face: Secondary | ICD-10-CM | POA: Diagnosis not present

## 2014-10-22 DIAGNOSIS — D485 Neoplasm of uncertain behavior of skin: Secondary | ICD-10-CM | POA: Diagnosis not present

## 2014-10-22 DIAGNOSIS — C44329 Squamous cell carcinoma of skin of other parts of face: Secondary | ICD-10-CM | POA: Diagnosis not present

## 2014-10-22 DIAGNOSIS — L570 Actinic keratosis: Secondary | ICD-10-CM | POA: Diagnosis not present

## 2014-11-27 DIAGNOSIS — C44329 Squamous cell carcinoma of skin of other parts of face: Secondary | ICD-10-CM | POA: Diagnosis not present

## 2014-12-05 ENCOUNTER — Encounter: Payer: Self-pay | Admitting: Interventional Cardiology

## 2014-12-05 ENCOUNTER — Ambulatory Visit (INDEPENDENT_AMBULATORY_CARE_PROVIDER_SITE_OTHER): Payer: Medicare Other | Admitting: Interventional Cardiology

## 2014-12-05 VITALS — BP 118/64 | HR 50 | Ht 70.0 in | Wt 205.0 lb

## 2014-12-05 DIAGNOSIS — I4892 Unspecified atrial flutter: Secondary | ICD-10-CM

## 2014-12-05 DIAGNOSIS — I4891 Unspecified atrial fibrillation: Secondary | ICD-10-CM

## 2014-12-05 DIAGNOSIS — E785 Hyperlipidemia, unspecified: Secondary | ICD-10-CM | POA: Diagnosis not present

## 2014-12-05 DIAGNOSIS — I2510 Atherosclerotic heart disease of native coronary artery without angina pectoris: Secondary | ICD-10-CM | POA: Diagnosis not present

## 2014-12-05 NOTE — Patient Instructions (Signed)
Medication Instructions:  Same-no change  Labwork: Lipids and CMET in September. Please do not eat or drink after midnight the night before labs are drawn.  Testing/Procedures: none  Follow-Up: Your physician wants you to follow-up in: 1 year. You will receive a reminder letter in the mail two months in advance. If you don't receive a letter, please call our office to schedule the follow-up appointment.

## 2014-12-05 NOTE — Progress Notes (Signed)
Patient ID: Arthur Gardner, male   DOB: 1946/03/23, 69 y.o.   MRN: [id number]     Cardiology Office Note   Date:  12/05/2014   ID:  Arthur Gardner, DOB 06/20/1946, MRN [id number]  PCP:  Shirline Frees, MD    No chief complaint on file. CAD   Wt Readings from Last 3 Encounters:  12/04/13 204 lb (92.534 kg)  09/15/10 195 lb (88.451 kg)  06/16/10 191 lb (86.637 kg)       History of Present Illness: Arthur Gardner is a 69 y.o. male  who has had a DES to the LAD.He walks daily. BP checked at home.  BP has been running higher than the usual 110/70.  SOme readings are in that range.  Not typically over 229 systolic. No symptoms like what he had before the stent. CAD/ASCVD:  He does yardwork. He works 2 days a week.; walks 3x/day. c/o Leg edema more at the end of the day, mild.  Denies : Chest pain.  Dizziness.  Dyspnea on exertion.  Nitroglycerin.  Orthopnea.  Palpitations much bettter with metoprolol. Very rarely takes an extra metoprolol tartrate.  Paroxysmal nocturnal dyspnea.  Syncope.  Feels well over the past year. Denies dizziness, chest pain, SHOB, orthopnea, PND, and LE edema. No bleeding. Walks 3x daily for about 69min, 66mile each time w/o ShOB, chest pain. Still eats things he shouldn't eat, but usually eats healthy. He retired in 4/15.   Occsaional vertigo type sx with a change in position.  This is a long standing problem.      Past Medical History  Diagnosis Date  . CAD (coronary artery disease)     status post drug-eluting stent to the left anterior descending.  2/10  . Atrial flutter   . Peptic ulcer     (remote).   . Hypercholesteremia   . HTN (hypertension)   . BPH (benign prostatic hyperplasia)   . Hearing loss     wears hearing aids  . Sleep apnea     Past Surgical History  Procedure Laterality Date  . Appendectomy    . Hernia repair  2000     Current Outpatient Prescriptions  Medication Sig Dispense Refill  . aspirin 81 MG tablet Take 81 mg by  mouth daily.    . Coenzyme Q10 (COQ-10 PO) 200 mg daily. 1 tab po qd    . Fish Oil OIL 2,000 mg 2 (two) times daily. bid    . lisinopril (PRINIVIL,ZESTRIL) 5 MG tablet TAKE 1 TABLET BY MOUTH EVERY DAY 90 tablet 1  . metoprolol succinate (TOPROL-XL) 25 MG 24 hr tablet prn     . metoprolol tartrate (LOPRESSOR) 25 MG tablet TAKE 1 TABLET BY MOUTH DAILY 90 tablet 0  . simvastatin (ZOCOR) 40 MG tablet TAKE 1 TABLET BY MOUTH EVERY DAY 30 tablet 6   No current facility-administered medications for this visit.    Allergies:   Other    Social History:  The patient  reports that he has quit smoking. He does not have any smokeless tobacco history on file. He reports that he drinks alcohol. He reports that he does not use illicit drugs.   Family History:  The patient's *family history includes Heart disease in his father.    ROS:  Please see the history of present illness.   Otherwise, review of systems are positive for occasional vertigo type sx.   All other systems are reviewed and negative.    PHYSICAL EXAM: VS:  There were no vitals taken for this visit. , BMI There is no weight on file to calculate BMI. GEN: Well nourished, well developed, in no acute distress HEENT: normal Neck: no JVD, carotid bruits, or masses Cardiac: RRR; no murmurs, rubs, or gallops,no edema  Respiratory:  clear to auscultation bilaterally, normal work of breathing GI: soft, nontender, nondistended, + BS MS: no deformity or atrophy Skin: warm and dry, no rash Neuro:  Strength and sensation are intact Psych: euthymic mood, full affect   EKG:   The ekg ordered today demonstrates NSR, no ST changes   Recent Labs: 03/17/2014: ALT 21; BUN 16; Creatinine 1.1; Potassium 4.5; Sodium 138   Lipid Panel    Component Value Date/Time   CHOL 138 03/17/2014 0914   TRIG 87.0 03/17/2014 0914   HDL 32.20* 03/17/2014 0914   CHOLHDL 4 03/17/2014 0914   VLDL 17.4 03/17/2014 0914   LDLCALC 88 03/17/2014 0914     Other  studies Reviewed: Additional studies/ records that were reviewed today with results demonstrating: labs as noted above.   ASSESSMENT AND PLAN:  CAD in native artery  Continue Aspirin Tablet Delayed Release, 81 MG, 1 tablet, Orally, Once a day Stopped Plavix Tablet, 75 MG, 1 tablet, Orally, Once a day in 2014 Refill Nitroglycerin SL tablet, 0.4 mg, as directed, 25, Refills 6 LAB: Comp Metabolic Panel LAB: Lipid Panel w/Direct LDL in 9/16 Notes: No angina. 4 years post DES. Stopped Plavix. Endeavor stent in 2010.  2. Combined hyperlipidemia  Continue Simvastatin Tablet, 20.0 Milligram, TAKE 1 TABLET BY MOUTH EVERY EVENING Notes: LDL 99 in 5/13.  Check lipids in 9/16. 3. Benign hypertension  Continue Lisinopril Tablet, 5.0 Milligram, TAKE 1 TABLET BY MOUTH EVERY DAY Continue Metoprolol Tartrate Tablet, 25 Milligram, 1 tablet, Orally, once a day; OK to take an extra if there are palpitations Notes: Controlled at home.  4. Mitral valve disorders  Notes: Prior MVP with eccentric MR in 2012.Marland Kitchen Checked LV funtion in 2014: normal LV function; mild eccentric MR, posteriorly directed ( anterior prolapse).  5. Atrial flutter: occurred at the time of a stress test. Controlled with metoprolol. Evaluated by Dr. Rayann Heman in the past. Sx much less frequent with metoprolol. If sx Get more frequent. WOuld have to consider monitor and discuss anticoagulation.   Dizziness/?vertigo: Be careful while going from sitting to standing. Long standing issue.  Current medicines are reviewed at length with the patient today.  The patient concerns regarding his medicines were addressed.  The following changes have been made:  No change  Labs/ tests ordered today include: lipids, CMet No orders of the defined types were placed in this encounter.    Recommend 150 minutes/week of aerobic exercise Low fat, low carb, high fiber diet recommended  Disposition:   FU in  1 year   Teresita Madura., MD    12/05/2014 11:53 AM    [facility] [place], [place], [place]  [address] Phone: [phone]; Fax: [phone]

## 2014-12-14 ENCOUNTER — Other Ambulatory Visit: Payer: Self-pay | Admitting: Interventional Cardiology

## 2015-01-09 DIAGNOSIS — H16423 Pannus (corneal), bilateral: Secondary | ICD-10-CM | POA: Diagnosis not present

## 2015-01-09 DIAGNOSIS — H40013 Open angle with borderline findings, low risk, bilateral: Secondary | ICD-10-CM | POA: Diagnosis not present

## 2015-01-09 DIAGNOSIS — H524 Presbyopia: Secondary | ICD-10-CM | POA: Diagnosis not present

## 2015-01-09 DIAGNOSIS — R42 Dizziness and giddiness: Secondary | ICD-10-CM | POA: Diagnosis not present

## 2015-02-11 DIAGNOSIS — L570 Actinic keratosis: Secondary | ICD-10-CM | POA: Diagnosis not present

## 2015-02-11 DIAGNOSIS — C44319 Basal cell carcinoma of skin of other parts of face: Secondary | ICD-10-CM | POA: Diagnosis not present

## 2015-02-11 DIAGNOSIS — C4491 Basal cell carcinoma of skin, unspecified: Secondary | ICD-10-CM

## 2015-02-11 DIAGNOSIS — C44310 Basal cell carcinoma of skin of unspecified parts of face: Secondary | ICD-10-CM | POA: Diagnosis not present

## 2015-02-11 HISTORY — DX: Basal cell carcinoma of skin, unspecified: C44.91

## 2015-03-05 DIAGNOSIS — C44310 Basal cell carcinoma of skin of unspecified parts of face: Secondary | ICD-10-CM | POA: Diagnosis not present

## 2015-03-10 ENCOUNTER — Other Ambulatory Visit (INDEPENDENT_AMBULATORY_CARE_PROVIDER_SITE_OTHER): Payer: Medicare Other | Admitting: *Deleted

## 2015-03-10 DIAGNOSIS — E785 Hyperlipidemia, unspecified: Secondary | ICD-10-CM | POA: Diagnosis not present

## 2015-03-10 LAB — COMPREHENSIVE METABOLIC PANEL
ALBUMIN: 4 g/dL (ref 3.5–5.2)
ALT: 17 U/L (ref 0–53)
AST: 27 U/L (ref 0–37)
Alkaline Phosphatase: 53 U/L (ref 39–117)
BUN: 15 mg/dL (ref 6–23)
CHLORIDE: 106 meq/L (ref 96–112)
CO2: 27 meq/L (ref 19–32)
Calcium: 9.2 mg/dL (ref 8.4–10.5)
Creatinine, Ser: 1.08 mg/dL (ref 0.40–1.50)
GFR: 71.67 mL/min (ref >60.00)
Glucose, Bld: 107 mg/dL — ABNORMAL HIGH (ref 70–99)
Potassium: 4.2 mEq/L (ref 3.5–5.1)
Sodium: 140 mEq/L (ref 135–145)
Total Bilirubin: 1.5 mg/dL — ABNORMAL HIGH (ref 0.2–1.2)
Total Protein: 7.2 g/dL (ref 6.0–8.3)

## 2015-03-10 LAB — LIPID PANEL
CHOL/HDL RATIO: 4
CHOLESTEROL: 127 mg/dL (ref 0–200)
HDL: 34.5 mg/dL — AB (ref >39.00)
LDL Cholesterol: 76 mg/dL (ref 0–99)
NonHDL: 92.96
TRIGLYCERIDES: 87 mg/dL (ref 0.0–149.0)
VLDL: 17.4 mg/dL (ref 0.0–40.0)

## 2015-03-13 ENCOUNTER — Other Ambulatory Visit: Payer: Self-pay | Admitting: Interventional Cardiology

## 2015-03-13 NOTE — Telephone Encounter (Signed)
Should this be 20mg  or 40mg ? His list has 40mg , but Dr Hassell Done dictation has 2. Combined hyperlipidemia  Continue Simvastatin Tablet, 20.0 Milligram, TAKE 1 TABLET BY MOUTH EVERY EVENING Notes: LDL 99 in 5/13. Check lipids in 9/16. Please advise. Thanks, MI

## 2015-03-17 ENCOUNTER — Other Ambulatory Visit: Payer: Self-pay

## 2015-03-17 NOTE — Telephone Encounter (Signed)
Jettie Booze, MD at 12/05/2014 11:46 AM  simvastatin (ZOCOR) 40 MG tabletTAKE 1 TABLET BY MOUTH EVERY D 2. Combined hyperlipidemia  Continue Simvastatin Tablet, 20.0 Milligram, TAKE 1 TABLET BY MOUTH EVERY EVENING Notes: LDL 99 in 5/13. Check lipids in 9/16.  Notes Recorded by Jettie Booze, MD on 03/10/2015 at 4:24 PM Lipids controlled. TBili slightly elevated which may benign. WOuld forward to PMD to see if further mgmt is needed.

## 2015-03-18 ENCOUNTER — Other Ambulatory Visit: Payer: Medicare Other

## 2015-03-19 ENCOUNTER — Other Ambulatory Visit: Payer: Self-pay

## 2015-03-24 ENCOUNTER — Other Ambulatory Visit: Payer: Self-pay

## 2015-03-24 ENCOUNTER — Telehealth: Payer: Self-pay | Admitting: Interventional Cardiology

## 2015-03-24 MED ORDER — SIMVASTATIN 40 MG PO TABS: 40.0000 mg | ORAL_TABLET | Freq: Every day | ORAL | Status: DC

## 2015-03-24 NOTE — Telephone Encounter (Signed)
ERROR

## 2015-04-02 NOTE — Telephone Encounter (Signed)
Spoke with pt and he states that he is taking 40mg .  Pt states that he called last week and spoke with Vaughan Basta and everything is fine now and he does not need a new prescription sent in. Advised pt to call us if he has any issues picking up this medication.

## 2015-04-02 NOTE — Telephone Encounter (Signed)
He soul continue what he is currently taking.

## 2015-05-12 DIAGNOSIS — H40013 Open angle with borderline findings, low risk, bilateral: Secondary | ICD-10-CM | POA: Diagnosis not present

## 2015-05-12 DIAGNOSIS — L718 Other rosacea: Secondary | ICD-10-CM | POA: Diagnosis not present

## 2015-05-12 DIAGNOSIS — H01003 Unspecified blepharitis right eye, unspecified eyelid: Secondary | ICD-10-CM | POA: Diagnosis not present

## 2015-06-05 DIAGNOSIS — R739 Hyperglycemia, unspecified: Secondary | ICD-10-CM | POA: Diagnosis not present

## 2015-06-05 DIAGNOSIS — I10 Essential (primary) hypertension: Secondary | ICD-10-CM | POA: Diagnosis not present

## 2015-06-05 DIAGNOSIS — E7800 Pure hypercholesterolemia, unspecified: Secondary | ICD-10-CM | POA: Diagnosis not present

## 2015-06-05 DIAGNOSIS — Z125 Encounter for screening for malignant neoplasm of prostate: Secondary | ICD-10-CM | POA: Diagnosis not present

## 2015-06-05 DIAGNOSIS — Z0000 Encounter for general adult medical examination without abnormal findings: Secondary | ICD-10-CM | POA: Diagnosis not present

## 2015-06-05 DIAGNOSIS — Z1211 Encounter for screening for malignant neoplasm of colon: Secondary | ICD-10-CM | POA: Diagnosis not present

## 2015-06-05 DIAGNOSIS — I2510 Atherosclerotic heart disease of native coronary artery without angina pectoris: Secondary | ICD-10-CM | POA: Diagnosis not present

## 2015-06-05 DIAGNOSIS — J449 Chronic obstructive pulmonary disease, unspecified: Secondary | ICD-10-CM | POA: Diagnosis not present

## 2015-06-10 DIAGNOSIS — L570 Actinic keratosis: Secondary | ICD-10-CM | POA: Diagnosis not present

## 2015-06-10 DIAGNOSIS — L720 Epidermal cyst: Secondary | ICD-10-CM | POA: Diagnosis not present

## 2015-06-18 DIAGNOSIS — Z1211 Encounter for screening for malignant neoplasm of colon: Secondary | ICD-10-CM | POA: Diagnosis not present

## 2015-07-02 DIAGNOSIS — L720 Epidermal cyst: Secondary | ICD-10-CM | POA: Diagnosis not present

## 2015-07-16 DIAGNOSIS — Z4802 Encounter for removal of sutures: Secondary | ICD-10-CM | POA: Diagnosis not present

## 2015-08-03 DIAGNOSIS — G4733 Obstructive sleep apnea (adult) (pediatric): Secondary | ICD-10-CM | POA: Diagnosis not present

## 2015-08-12 DIAGNOSIS — L570 Actinic keratosis: Secondary | ICD-10-CM | POA: Diagnosis not present

## 2015-08-12 DIAGNOSIS — D239 Other benign neoplasm of skin, unspecified: Secondary | ICD-10-CM | POA: Diagnosis not present

## 2015-12-03 ENCOUNTER — Encounter: Payer: Self-pay | Admitting: Interventional Cardiology

## 2015-12-03 ENCOUNTER — Ambulatory Visit (INDEPENDENT_AMBULATORY_CARE_PROVIDER_SITE_OTHER): Payer: Medicare Other | Admitting: Interventional Cardiology

## 2015-12-03 VITALS — BP 90/60 | HR 72 | Ht 70.0 in | Wt 206.0 lb

## 2015-12-03 DIAGNOSIS — E785 Hyperlipidemia, unspecified: Secondary | ICD-10-CM

## 2015-12-03 DIAGNOSIS — I2510 Atherosclerotic heart disease of native coronary artery without angina pectoris: Secondary | ICD-10-CM

## 2015-12-03 DIAGNOSIS — I4891 Unspecified atrial fibrillation: Secondary | ICD-10-CM | POA: Diagnosis not present

## 2015-12-03 DIAGNOSIS — I10 Essential (primary) hypertension: Secondary | ICD-10-CM | POA: Diagnosis not present

## 2015-12-03 DIAGNOSIS — E782 Mixed hyperlipidemia: Secondary | ICD-10-CM

## 2015-12-03 NOTE — Progress Notes (Signed)
Cardiology Office Note   Date:  12/03/2015   ID:  Arthur Gardner, DOB 22-Oct-1945, MRN [id number]  PCP:  Arthur Frees, MD    No chief complaint on file. f/u CAD   Wt Readings from Last 3 Encounters:  12/03/15 206 lb (93.441 kg)  12/05/14 205 lb (92.987 kg)  12/04/13 204 lb (92.534 kg)       History of Present Illness: Arthur Gardner is a 70 y.o. male  who has had a DES to the LAD in 3/10.  He walks daily. BP checked at home. BP has been running higher than the usual 110/70. SOme readings are in that range. Not typically over [id number] systolic. No symptoms like what he had before the stent. Todays BP is lower than usual.  CAD/ASCVD:  He does yardwork. He works 2 days a week.; walks 3x/day.  c/o Leg edema more at the end of the day, mild.  Denies : Chest pain. Dizziness. Dyspnea on exertion. Nitroglycerin. Orthopnea.   Palpitations much bettter with metoprolol. Very rarely takes an extra metoprolol tartrate.  Paroxysmal nocturnal dyspnea.  Syncope.   Feels well over the past year. Denies dizziness, chest pain, SHOB, orthopnea, PND, and LE edema. No bleeding. Walks 3x daily for about 10min, 8mile each time w/o ShOB, chest pain. Avoids walking in hot weather. Still eats things he shouldn't eat, but usually eats healthy. He retired in 4/15.   Occsaional vertigo type sx with a change in position. This is a long standing problem.   Occasionally feels an irregular heartbeat, worse with lying down- once every 6 weeks.    Past Medical History  Diagnosis Date  . CAD (coronary artery disease)     status post drug-eluting stent to the left anterior descending.  2/10  . Atrial flutter ([place])   . Peptic ulcer     (remote).   . Hypercholesteremia   . HTN (hypertension)   . BPH (benign prostatic hyperplasia)   . Hearing loss     wears hearing aids  . Sleep apnea     Past Surgical History  Procedure Laterality Date  . Appendectomy    . Hernia repair  2000     Current  Outpatient Prescriptions  Medication Sig Dispense Refill  . aspirin 81 MG tablet Take 81 mg by mouth daily.    . Coenzyme Q10 (COQ-10 PO) 200 mg daily. 1 tab po qd    . Fish Oil OIL 2,000 mg 2 (two) times daily. bid    . lisinopril (PRINIVIL,ZESTRIL) 5 MG tablet TAKE 1 TABLET BY MOUTH EVERY DAY 90 tablet 3  . metoprolol tartrate (LOPRESSOR) 25 MG tablet TAKE 1 TABLET BY MOUTH DAILY 90 tablet 3  . simvastatin (ZOCOR) 40 MG tablet Take 1 tablet (40 mg total) by mouth daily. 90 tablet 3   No current facility-administered medications for this visit.    Allergies:   Other    Social History:  The patient  reports that he has quit smoking. He does not have any smokeless tobacco history on file. He reports that he drinks alcohol. He reports that he does not use illicit drugs.   Family History:  The patient's family history includes Heart disease in his father; Hypertension in his mother. There is no history of Heart attack.    ROS:  Please see the history of present illness.   Otherwise, review of systems are positive for Occasional palpitations.   All other systems are reviewed and negative.  PHYSICAL EXAM: VS:  BP 90/60 mmHg  Pulse 72  Ht 5\' 10"  (1.778 m)  Wt 206 lb (93.441 kg)  BMI 29.56 kg/m2 , BMI Body mass index is 29.56 kg/(m^2). GEN: Well nourished, well developed, in no acute distress HEENT: normal Neck: no JVD, carotid bruits, or masses Cardiac: Irregularly irregular; no murmurs, rubs, or gallops,no edema  Respiratory:  clear to auscultation bilaterally, normal work of breathing GI: soft, nontender, nondistended, + BS MS: no deformity or atrophy Skin: warm and dry, no rash Neuro:  Strength and sensation are intact Psych: euthymic mood, full affect   EKG:   The ekg ordered today demonstrates atrial fibrillation, rate controlled   Recent Labs: 03/10/2015: ALT 17; BUN 15; Creatinine, Ser 1.08; Potassium 4.2; Sodium 140   Lipid Panel    Component Value Date/Time    CHOL 127 03/10/2015 0829   TRIG 87.0 03/10/2015 0829   HDL 34.50* 03/10/2015 0829   CHOLHDL 4 03/10/2015 0829   VLDL 17.4 03/10/2015 0829   LDLCALC 76 03/10/2015 0829     Other studies Reviewed: Additional studies/ records that were reviewed today with results demonstrating: Prior echo results noted below.   ASSESSMENT AND PLAN:  1. CAD : No angina. Continue aggressive secondary prevention. 2. Hyperlipidemia: Labs to be checked with Arthur Gardner next week. I would also asked that he check a TSH due to the new onset atrial fibrillation. 3. HTN: Blood pressure well controlled. 4. Mitral valve disorders: Prior MVP with eccentric MR in 2012.Marland Kitchen Checked LV funtion in 2014: normal LV function; mild eccentric MR, posteriorly directed ( anterior prolapse). 5. Atrial flutterAfib: Occurs transiently once a month per his report.  He is in AFib today.  Check echo.  Discussed anticoagulation.  Continue aspirin for now. If his echo shows significant structural heart disease, will have to consider more potent anticoagulation such as Eliquis or Coumadin. Would also have to consider cardioversion in about 30 days. This could be related to his mitral valve disease as well.  He is hesitant to start more potent blood thinners at this time.   Current medicines are reviewed at length with the patient today.  The patient concerns regarding his medicines were addressed.  The following changes have been made:  No change  Labs/ tests ordered today include:   Orders Placed This Encounter  Procedures  . EKG 12-Lead    Recommend 150 minutes/week of aerobic exercise Low fat, low carb, high fiber diet recommended  Disposition:   FU in for echocardiogram   Signed, Larae Grooms, MD  12/03/2015 10:05 AM    [facility] [place], [place], [place]  [address] Phone: [phone]; Fax: [phone]

## 2015-12-03 NOTE — Patient Instructions (Signed)
Medication Instructions:  Same-no changes  Labwork: None  Testing/Procedures: Your physician has requested that you have an echocardiogram. Echocardiography is a painless test that uses sound waves to create images of your heart. It provides your doctor with information about the size and shape of your heart and how well your heart's chambers and valves are working. This procedure takes approximately one hour. There are no restrictions for this procedure.  Follow-Up: Based on Echo results.     If you need a refill on your cardiac medications before your next appointment, please call your pharmacy.

## 2015-12-09 DIAGNOSIS — R7309 Other abnormal glucose: Secondary | ICD-10-CM | POA: Diagnosis not present

## 2015-12-09 DIAGNOSIS — I10 Essential (primary) hypertension: Secondary | ICD-10-CM | POA: Diagnosis not present

## 2015-12-09 DIAGNOSIS — D691 Qualitative platelet defects: Secondary | ICD-10-CM | POA: Diagnosis not present

## 2015-12-10 ENCOUNTER — Encounter: Payer: Self-pay | Admitting: Interventional Cardiology

## 2015-12-14 ENCOUNTER — Other Ambulatory Visit: Payer: Self-pay

## 2015-12-14 MED ORDER — METOPROLOL TARTRATE 25 MG PO TABS: 25.0000 mg | ORAL_TABLET | Freq: Every day | ORAL | Status: DC

## 2015-12-14 MED ORDER — LISINOPRIL 5 MG PO TABS: 5.0000 mg | ORAL_TABLET | Freq: Every day | ORAL | Status: DC

## 2015-12-22 ENCOUNTER — Other Ambulatory Visit: Payer: Self-pay

## 2015-12-22 ENCOUNTER — Ambulatory Visit (HOSPITAL_COMMUNITY): Payer: Medicare Other | Attending: Cardiology

## 2015-12-22 DIAGNOSIS — I4891 Unspecified atrial fibrillation: Secondary | ICD-10-CM | POA: Diagnosis not present

## 2015-12-22 DIAGNOSIS — E7800 Pure hypercholesterolemia, unspecified: Secondary | ICD-10-CM | POA: Diagnosis not present

## 2015-12-22 DIAGNOSIS — I119 Hypertensive heart disease without heart failure: Secondary | ICD-10-CM | POA: Insufficient documentation

## 2015-12-22 DIAGNOSIS — I340 Nonrheumatic mitral (valve) insufficiency: Secondary | ICD-10-CM | POA: Diagnosis not present

## 2015-12-22 DIAGNOSIS — I77810 Thoracic aortic ectasia: Secondary | ICD-10-CM | POA: Insufficient documentation

## 2015-12-22 LAB — ECHOCARDIOGRAM COMPLETE
AVLVOTPG: 2 mmHg
Ao-asc: 38 cm
EWDT: 250 ms
FS: 36 % (ref 28–44)
IV/PV OW: 1.1
LA ID, A-P, ES: 44 mm
LADIAMINDEX: 2.09 cm/m2
LAVOL: 82 mL
LAVOLA4C: 68 mL
LAVOLIN: 38.9 mL/m2
LEFT ATRIUM END SYS DIAM: 44 mm
LVOT SV: 48 mL
LVOT VTI: 15.4 cm
LVOT area: 3.14 cm2
LVOT peak vel: 78.2 cm/s
LVOTD: 20 mm
MV Dec: 250
MV Peak grad: 2 mmHg
MV pk E vel: 73.6 m/s
PW: 9.44 mm — AB (ref 0.6–1.1)
Reg peak vel: 236 cm/s
TRMAXVEL: 236 cm/s

## 2015-12-23 ENCOUNTER — Other Ambulatory Visit: Payer: Self-pay

## 2015-12-23 DIAGNOSIS — I4891 Unspecified atrial fibrillation: Secondary | ICD-10-CM

## 2015-12-23 DIAGNOSIS — Z01812 Encounter for preprocedural laboratory examination: Secondary | ICD-10-CM

## 2015-12-23 MED ORDER — APIXABAN 5 MG PO TABS: 5.0000 mg | ORAL_TABLET | Freq: Two times a day (BID) | ORAL | Status: DC

## 2015-12-25 ENCOUNTER — Telehealth: Payer: Self-pay

## 2015-12-25 NOTE — Telephone Encounter (Signed)
Prior auth for Eliquis 5mg  submitted to [facility].

## 2015-12-25 NOTE — Telephone Encounter (Signed)
Fax from [facility] with approval for Eliquis. Good through 12/24/2018.

## 2016-01-07 ENCOUNTER — Other Ambulatory Visit (INDEPENDENT_AMBULATORY_CARE_PROVIDER_SITE_OTHER): Payer: Medicare Other

## 2016-01-07 DIAGNOSIS — I4891 Unspecified atrial fibrillation: Secondary | ICD-10-CM | POA: Diagnosis not present

## 2016-01-07 DIAGNOSIS — Z01812 Encounter for preprocedural laboratory examination: Secondary | ICD-10-CM | POA: Diagnosis not present

## 2016-01-07 LAB — CBC WITH DIFFERENTIAL/PLATELET
BASOS PCT: 0 %
Basophils Absolute: 0 cells/uL (ref 0–200)
EOS ABS: 177 {cells}/uL (ref 15–500)
Eosinophils Relative: 3 %
HCT: 43.3 % (ref 38.5–50.0)
HEMOGLOBIN: 14.6 g/dL (ref 13.2–17.1)
LYMPHS ABS: 1711 {cells}/uL (ref 850–3900)
Lymphocytes Relative: 29 %
MCH: 31.2 pg (ref 27.0–33.0)
MCHC: 33.7 g/dL (ref 32.0–36.0)
MCV: 92.5 fL (ref 80.0–100.0)
MONO ABS: 531 {cells}/uL (ref 200–950)
MONOS PCT: 9 %
MPV: 10.6 fL (ref 7.5–12.5)
NEUTROS ABS: 3481 {cells}/uL (ref 1500–7800)
Neutrophils Relative %: 59 %
PLATELETS: 138 10*3/uL — AB (ref 140–400)
RBC: 4.68 MIL/uL (ref 4.20–5.80)
RDW: 13.1 % (ref 11.0–15.0)
WBC: 5.9 10*3/uL (ref 3.8–10.8)

## 2016-01-07 LAB — BASIC METABOLIC PANEL
BUN: 22 mg/dL (ref 7–25)
CHLORIDE: 107 mmol/L (ref 98–110)
CO2: 26 mmol/L (ref 20–31)
CREATININE: 1.14 mg/dL (ref 0.70–1.18)
Calcium: 8.9 mg/dL (ref 8.6–10.3)
Glucose, Bld: 107 mg/dL — ABNORMAL HIGH (ref 65–99)
POTASSIUM: 4.6 mmol/L (ref 3.5–5.3)
SODIUM: 140 mmol/L (ref 135–146)

## 2016-01-07 LAB — PROTIME-INR
INR: 1.1
Prothrombin Time: 11.6 s — ABNORMAL HIGH (ref 9.0–11.5)

## 2016-01-07 LAB — APTT: aPTT: 31 s (ref 22–34)

## 2016-01-12 DIAGNOSIS — H25013 Cortical age-related cataract, bilateral: Secondary | ICD-10-CM | POA: Diagnosis not present

## 2016-01-12 DIAGNOSIS — H40013 Open angle with borderline findings, low risk, bilateral: Secondary | ICD-10-CM | POA: Diagnosis not present

## 2016-01-12 DIAGNOSIS — H04123 Dry eye syndrome of bilateral lacrimal glands: Secondary | ICD-10-CM | POA: Diagnosis not present

## 2016-01-12 DIAGNOSIS — H2513 Age-related nuclear cataract, bilateral: Secondary | ICD-10-CM | POA: Diagnosis not present

## 2016-01-14 ENCOUNTER — Encounter (HOSPITAL_COMMUNITY): Payer: Self-pay | Admitting: *Deleted

## 2016-01-14 ENCOUNTER — Ambulatory Visit (HOSPITAL_COMMUNITY): Payer: Medicare Other | Admitting: Certified Registered Nurse Anesthetist

## 2016-01-14 ENCOUNTER — Encounter (HOSPITAL_COMMUNITY)
Admission: RE | Disposition: A | Discharge status: Home / Self Care | Payer: Self-pay | Source: Ambulatory Visit | Attending: Internal Medicine

## 2016-01-14 ENCOUNTER — Ambulatory Visit (HOSPITAL_COMMUNITY)
Admission: RE | Admit: 2016-01-14 | Discharge: 2016-01-14 | Disposition: A | Discharge status: Home / Self Care | Payer: Medicare Other | Source: Ambulatory Visit | Attending: Internal Medicine | Admitting: Internal Medicine

## 2016-01-14 DIAGNOSIS — I2510 Atherosclerotic heart disease of native coronary artery without angina pectoris: Secondary | ICD-10-CM | POA: Diagnosis not present

## 2016-01-14 DIAGNOSIS — I4819 Other persistent atrial fibrillation: Secondary | ICD-10-CM | POA: Insufficient documentation

## 2016-01-14 DIAGNOSIS — Z79899 Other long term (current) drug therapy: Secondary | ICD-10-CM | POA: Insufficient documentation

## 2016-01-14 DIAGNOSIS — I4891 Unspecified atrial fibrillation: Secondary | ICD-10-CM | POA: Diagnosis not present

## 2016-01-14 DIAGNOSIS — Z951 Presence of aortocoronary bypass graft: Secondary | ICD-10-CM | POA: Diagnosis not present

## 2016-01-14 DIAGNOSIS — E7800 Pure hypercholesterolemia, unspecified: Secondary | ICD-10-CM | POA: Diagnosis not present

## 2016-01-14 DIAGNOSIS — I481 Persistent atrial fibrillation: Secondary | ICD-10-CM | POA: Diagnosis not present

## 2016-01-14 DIAGNOSIS — Z87891 Personal history of nicotine dependence: Secondary | ICD-10-CM | POA: Diagnosis not present

## 2016-01-14 DIAGNOSIS — G4730 Sleep apnea, unspecified: Secondary | ICD-10-CM | POA: Insufficient documentation

## 2016-01-14 DIAGNOSIS — Z7982 Long term (current) use of aspirin: Secondary | ICD-10-CM | POA: Diagnosis not present

## 2016-01-14 DIAGNOSIS — I10 Essential (primary) hypertension: Secondary | ICD-10-CM | POA: Diagnosis not present

## 2016-01-14 HISTORY — PX: CARDIOVERSION: SHX1299

## 2016-01-14 SURGERY — CARDIOVERSION
Anesthesia: General

## 2016-01-14 MED ORDER — LIDOCAINE HCL (CARDIAC) 20 MG/ML IV SOLN
INTRAVENOUS | Status: DC | PRN
  Administered 2016-01-14: 40 mg via INTRATRACHEAL

## 2016-01-14 MED ORDER — PROPOFOL 10 MG/ML IV BOLUS
INTRAVENOUS | Status: DC | PRN
  Administered 2016-01-14: 70 mg via INTRAVENOUS

## 2016-01-14 MED ORDER — SODIUM CHLORIDE 0.9 % IV SOLN: INTRAVENOUS | Status: DC

## 2016-01-14 NOTE — Op Note (Signed)
Patient anesthetized by anesthesia with Propofol With pads in AP position pt cardioverted with 150 J synchronized biphasic energy.  Unsuccessful  Repeated with 200 J synchronized biphasic energy and then successful at conversion to SB 12 lead EKG pending   Procedure without complication.

## 2016-01-14 NOTE — Discharge Instructions (Signed)

## 2016-01-14 NOTE — H&P (Signed)
History of Present Illness: Arthur Gardner is a 70 y.o. male who has had a DES to the LAD in 3/10. He walks daily. BP checked at home. BP has been running higher than the usual 110/70. SOme readings are in that range. Not typically over [id number] systolic. No symptoms like what he had before the stent. Todays BP is lower than usual.  CAD/ASCVD:  He does yardwork. He works 2 days a week.; walks 3x/day.  c/o Leg edema more at the end of the day, mild.  Denies : Chest pain. Dizziness. Dyspnea on exertion. Nitroglycerin. Orthopnea.   Palpitations much bettter with metoprolol. Very rarely takes an extra metoprolol tartrate.  Paroxysmal nocturnal dyspnea.  Syncope.   Feels well over the past year. Denies dizziness, chest pain, SHOB, orthopnea, PND, and LE edema. No bleeding. Walks 3x daily for about 32min, 67mile each time w/o ShOB, chest pain. Avoids walking in hot weather. Still eats things he shouldn't eat, but usually eats healthy. He retired in 4/15.   Occsaional vertigo type sx with a change in position. This is a long standing problem.   Occasionally feels an irregular heartbeat, worse with lying down- once every 6 weeks.    Past Medical History  Diagnosis Date  . CAD (coronary artery disease)     status post drug-eluting stent to the left anterior descending. 2/10  . Atrial flutter ([place])   . Peptic ulcer     (remote).   . Hypercholesteremia   . HTN (hypertension)   . BPH (benign prostatic hyperplasia)   . Hearing loss     wears hearing aids  . Sleep apnea     Past Surgical History  Procedure Laterality Date  . Appendectomy    . Hernia repair  2000     Current Outpatient Prescriptions  Medication Sig Dispense Refill  . aspirin 81 MG tablet Take 81 mg by mouth daily.    . Coenzyme Q10 (COQ-10 PO) 200 mg daily. 1 tab po qd    . Fish Oil OIL 2,000 mg 2 (two) times daily. bid    . lisinopril  (PRINIVIL,ZESTRIL) 5 MG tablet TAKE 1 TABLET BY MOUTH EVERY DAY 90 tablet 3  . metoprolol tartrate (LOPRESSOR) 25 MG tablet TAKE 1 TABLET BY MOUTH DAILY 90 tablet 3  . simvastatin (ZOCOR) 40 MG tablet Take 1 tablet (40 mg total) by mouth daily. 90 tablet 3   No current facility-administered medications for this visit.    Allergies: Other    Social History: The patient  reports that he has quit smoking. He does not have any smokeless tobacco history on file. He reports that he drinks alcohol. He reports that he does not use illicit drugs.   Family History: The patient's family history includes Heart disease in his father; Hypertension in his mother. There is no history of Heart attack.    ROS: Please see the history of present illness. Otherwise, review of systems are positive for Occasional palpitations. All other systems are reviewed and negative.    PHYSICAL EXAM: VS: BP 90/60 mmHg  Pulse 72  Ht 5\' 10"  (1.778 m)  Wt 206 lb (93.441 kg)  BMI 29.56 kg/m2 , BMI Body mass index is 29.56 kg/(m^2). GEN: Well nourished, well developed, in no acute distress  HEENT: normal  Neck: no JVD, carotid bruits, or masses Cardiac: Irregularly irregular; no murmurs, rubs, or gallops,no edema  Respiratory: clear to auscultation bilaterally, normal work of breathing GI: soft, nontender, nondistended, + BS MS: no  deformity or atrophy  Skin: warm and dry, no rash Neuro: Strength and sensation are intact Psych: euthymic mood, full affect   Tele:  Atrial fib    Recent Labs: 03/10/2015: ALT 17; BUN 15; Creatinine, Ser 1.08; Potassium 4.2; Sodium 140   Lipid Panel  Labs (Brief)       Component Value Date/Time   CHOL 127 03/10/2015 0829   TRIG 87.0 03/10/2015 0829   HDL 34.50* 03/10/2015 0829   CHOLHDL 4 03/10/2015 0829   VLDL 17.4 03/10/2015 0829   LDLCALC 76 03/10/2015 0829      Other studies Reviewed: Additional studies/ records  that were reviewed today with results demonstrating: Prior echo results noted below.   1. ASSESSMENT AND PLAN:  Atrial fibriilation  Plan for cardioversion today  Pt understands procedure and agrees to proceed.

## 2016-01-14 NOTE — Anesthesia Preprocedure Evaluation (Signed)
Anesthesia Evaluation  Patient identified by MRN, date of birth, ID band Patient awake    Reviewed: Allergy & Precautions, NPO status , Patient's Chart, lab work & pertinent test results  Airway Mallampati: II  TM Distance: >3 FB Neck ROM: Full    Dental no notable dental hx.    Pulmonary sleep apnea , former smoker,    Pulmonary exam normal breath sounds clear to auscultation       Cardiovascular hypertension, Pt. on medications + CAD  negative cardio ROS Normal cardiovascular exam Rhythm:Regular Rate:Normal     Neuro/Psych negative neurological ROS  negative psych ROS   GI/Hepatic negative GI ROS, Neg liver ROS, PUD,   Endo/Other  negative endocrine ROS  Renal/GU negative Renal ROS     Musculoskeletal negative musculoskeletal ROS (+)   Abdominal   Peds  Hematology negative hematology ROS (+)   Anesthesia Other Findings   Reproductive/Obstetrics negative OB ROS                             Anesthesia Physical Anesthesia Plan  ASA: III  Anesthesia Plan: General   Post-op Pain Management:    Induction: Intravenous  Airway Management Planned: Mask  Additional Equipment:   Intra-op Plan:   Post-operative Plan:   Informed Consent: I have reviewed the patients History and Physical, chart, labs and discussed the procedure including the risks, benefits and alternatives for the proposed anesthesia with the patient or authorized representative who has indicated his/her understanding and acceptance.   Dental advisory given  Plan Discussed with: CRNA  Anesthesia Plan Comments:         Anesthesia Quick Evaluation

## 2016-01-14 NOTE — Anesthesia Postprocedure Evaluation (Signed)
Anesthesia Post Note  Patient: Arthur Gardner  Procedure(s) Performed: Procedure(s) (LRB): CARDIOVERSION (N/A)  Patient location during evaluation: Endoscopy Anesthesia Type: General Level of consciousness: awake and alert and oriented Pain management: pain level controlled Vital Signs Assessment: post-procedure vital signs reviewed and stable Respiratory status: spontaneous breathing Cardiovascular status: blood pressure returned to baseline and stable Postop Assessment: no headache and no signs of nausea or vomiting Anesthetic complications: no    Last Vitals:  Filed Vitals:   01/14/16 1118  BP: 143/86  Pulse: 62  Temp: 36.6 C  Resp: 15    Last Pain: There were no vitals filed for this visit.               [place] Pink

## 2016-01-14 NOTE — Transfer of Care (Signed)
Immediate Anesthesia Transfer of Care Note  Patient: Arthur Gardner  Procedure(s) Performed: Procedure(s): CARDIOVERSION (N/A)  Patient Location: Endoscopy Unit  Anesthesia Type:General  Level of Consciousness: awake, alert  and oriented  Airway & Oxygen Therapy: Patient Spontanous Breathing  Post-op Assessment: Report given to RN and Post -op Vital signs reviewed and stable  Post vital signs: Reviewed and stable  Last Vitals:  Filed Vitals:   01/14/16 1118  BP: 143/86  Pulse: 62  Temp: 36.6 C  Resp: 15    Last Pain: There were no vitals filed for this visit.       Complications: No apparent anesthesia complications

## 2016-02-09 DIAGNOSIS — L570 Actinic keratosis: Secondary | ICD-10-CM | POA: Diagnosis not present

## 2016-02-15 ENCOUNTER — Ambulatory Visit (INDEPENDENT_AMBULATORY_CARE_PROVIDER_SITE_OTHER): Payer: Medicare Other | Admitting: Nurse Practitioner

## 2016-02-15 ENCOUNTER — Encounter: Payer: Self-pay | Admitting: Nurse Practitioner

## 2016-02-15 ENCOUNTER — Telehealth (HOSPITAL_COMMUNITY): Payer: Self-pay | Admitting: *Deleted

## 2016-02-15 VITALS — BP 108/80 | HR 59 | Ht 70.0 in | Wt 206.8 lb

## 2016-02-15 DIAGNOSIS — R0789 Other chest pain: Secondary | ICD-10-CM

## 2016-02-15 DIAGNOSIS — I482 Chronic atrial fibrillation, unspecified: Secondary | ICD-10-CM

## 2016-02-15 DIAGNOSIS — I259 Chronic ischemic heart disease, unspecified: Secondary | ICD-10-CM

## 2016-02-15 LAB — BASIC METABOLIC PANEL WITH GFR
BUN: 17 mg/dL (ref 7–25)
CO2: 27 mmol/L (ref 20–31)
Calcium: 8.9 mg/dL (ref 8.6–10.3)
Chloride: 106 mmol/L (ref 98–110)
Creat: 1.01 mg/dL (ref 0.70–1.18)
Glucose, Bld: 89 mg/dL (ref 65–99)
Potassium: 4.7 mmol/L (ref 3.5–5.3)
Sodium: 140 mmol/L (ref 135–146)

## 2016-02-15 LAB — CBC
HCT: 45.3 % (ref 38.5–50.0)
Hemoglobin: 15.3 g/dL (ref 13.2–17.1)
MCH: 31.5 pg (ref 27.0–33.0)
MCHC: 33.8 g/dL (ref 32.0–36.0)
MCV: 93.4 fL (ref 80.0–100.0)
MPV: 10.6 fL (ref 7.5–12.5)
Platelets: 153 K/uL (ref 140–400)
RBC: 4.85 MIL/uL (ref 4.20–5.80)
RDW: 13 % (ref 11.0–15.0)
WBC: 7.4 K/uL (ref 3.8–10.8)

## 2016-02-15 NOTE — Progress Notes (Signed)
CARDIOLOGY OFFICE NOTE  Date:  02/15/2016    Arthur Gardner Date of Birth: 1946/03/21 Medical Record [id number]  PCP:  Arthur Frees, MD  Cardiologist:  [facility]    Chief Complaint  Patient presents with  . Chest Pain  . Atrial Fibrillation    Post cardioversion visit - seen for Arthur Gardner    History of Present Illness: Arthur Gardner is a 70 y.o. male who presents today for a post cardioversion visit. Seen for Arthur Gardner.   He has CAD with prior stenting of the LAD back in 2010. Other issues include HTN, HLD, PUD and atrial flutter.   Seen here back in early June. Found to be in AF. Echo updated. Eliquis started. Referred on for cardioversion a month ago.   Comes in today. Here alone. He is not happy. Very upset about being on Eliquis. Knows that he is back out of rhythm. Very vague in telling me how he feels. Says he is always dizzy and lightheaded and "just lives that way". Some chest pain over the left breast - not exertional. Says he is not sure if it is like what he had in the past - "he tried to not remember that and argued then that it was not his heart".  No real change in his symptoms since his cardioversion.   Past Medical History:  Diagnosis Date  . Atrial flutter ([place])   . BPH (benign prostatic hyperplasia)   . CAD (coronary artery disease)    status post drug-eluting stent to the left anterior descending.  2/10  . Hearing loss    wears hearing aids  . HTN (hypertension)   . Hypercholesteremia   . Peptic ulcer    (remote).   . Sleep apnea     Past Surgical History:  Procedure Laterality Date  . APPENDECTOMY    . CARDIOVERSION N/A 01/14/2016   Procedure: CARDIOVERSION;  Surgeon: Arthur Records, MD;  [facility] ENDOSCOPY;  Service: Cardiovascular;  Laterality: N/A;  . HERNIA REPAIR  2000     Medications: Current Outpatient Prescriptions  Medication Sig Dispense Refill  . apixaban (ELIQUIS) 5 MG TABS tablet Take 1 tablet (5 mg total) by mouth 2  (two) times daily. 60 tablet 3  . aspirin 81 MG tablet Take 81 mg by mouth daily.    . Coenzyme Q10 (COQ-10 PO) 200 mg daily. 1 tab po qd    . Fish Oil OIL 2,000 mg 2 (two) times daily. bid    . lisinopril (PRINIVIL,ZESTRIL) 5 MG tablet Take 1 tablet (5 mg total) by mouth daily. 90 tablet 3  . metoprolol tartrate (LOPRESSOR) 25 MG tablet Take 1 tablet (25 mg total) by mouth daily. 90 tablet 3  . simvastatin (ZOCOR) 40 MG tablet Take 1 tablet (40 mg total) by mouth daily. 90 tablet 3   No current facility-administered medications for this visit.     Allergies: Allergies  Allergen Reactions  . Other Photosensitivity    Steroids eye drops    Social History: The patient  reports that he has quit smoking. He does not have any smokeless tobacco history on file. He reports that he drinks alcohol. He reports that he does not use drugs.   Family History: The patient's family history includes Heart disease in his father; Hypertension in his mother.   Review of Systems: Please see the history of present illness.   Otherwise, the review of systems is positive for none.   All other  systems are reviewed and negative.   Physical Exam: VS:  BP 108/80   Pulse (!) 59   Ht 5\' 10"  (1.778 m)   Wt 206 lb 12.8 oz (93.8 kg)   BMI 29.67 kg/m  .  BMI Body mass index is 29.67 kg/m.  Wt Readings from Last 3 Encounters:  02/15/16 206 lb 12.8 oz (93.8 kg)  01/14/16 206 lb (93.4 kg)  12/03/15 206 lb (93.4 kg)    General: Seems very frustrated. He is alert and in no acute distress.   HEENT: Normal.  Neck: Supple, no JVD, carotid bruits, or masses noted.  Cardiac: Irregular irregular rhythm. Rate is fine.  No edema.  Respiratory:  Lungs are clear to auscultation bilaterally with normal work of breathing.  GI: Soft and nontender.  MS: No deformity or atrophy. Gait and ROM intact.  Skin: Warm and dry. Color is normal.  Neuro:  Strength and sensation are intact and no gross focal deficits noted.    Psych: Alert, appropriate and with normal affect.   LABORATORY DATA:  EKG:  EKG is ordered today. This demonstrates AF with a controlled VR of 59.  Lab Results  Component Value Date   WBC 5.9 01/07/2016   HGB 14.6 01/07/2016   HCT 43.3 01/07/2016   PLT 138 (L) 01/07/2016   GLUCOSE 107 (H) 01/07/2016   CHOL 127 03/10/2015   TRIG 87.0 03/10/2015   HDL 34.50 (L) 03/10/2015   LDLCALC 76 03/10/2015   ALT 17 03/10/2015   AST 27 03/10/2015   NA 140 01/07/2016   K 4.6 01/07/2016   CL 107 01/07/2016   CREATININE 1.14 01/07/2016   BUN 22 01/07/2016   CO2 26 01/07/2016   INR 1.1 01/07/2016    BNP (last 3 results) No results for input(s): BNP in the last 8760 hours.  ProBNP (last 3 results) No results for input(s): PROBNP in the last 8760 hours.   Other Studies Reviewed Today:  Echo Study Conclusions from 12/2015  - Left ventricle: The cavity size was normal. Wall thickness was   increased in a pattern of mild LVH. The estimated ejection   fraction was 55%. Wall motion was normal; there were no regional   wall motion abnormalities. Indeterminant diastolic function,   atrial fibrillation. - Aortic valve: There was no stenosis. - Aorta: Ascending aortic diameter: 38 mm (S). - Ascending aorta: The ascending aorta was mildly dilated. - Mitral valve: Mildly calcified annulus. Mildly calcified leaflets   . There was trivial regurgitation. - Left atrium: The atrium was mildly dilated. - Right ventricle: The cavity size was mildly dilated. Systolic   function was normal. - Right atrium: The atrium was mildly dilated. - Tricuspid valve: Peak RV-RA gradient (S): 22 mm Hg. - Pulmonary arteries: PA peak pressure: 25 mm Hg (S). - Inferior vena cava: The vessel was normal in size. The   respirophasic diameter changes were in the normal range (>= 50%),   consistent with normal central venous pressure.  Impressions:  - The patient was in atrial fibrillation. Normal LV size with  mild   LV hypertrophy. EF 55%. Mildly dilated RV with normal systolic   function. Mild biatrial enlargement.   Op Note Date of Service: 01/14/2016 12:14 PM Arthur Records, MD  Cardioversion    Patient anesthetized by anesthesia with Propofol With pads in AP position pt cardioverted with 150 J synchronized biphasic energy.  Unsuccessful  Repeated with 200 J synchronized biphasic energy and then successful at conversion to  SB 12 lead EKG pending   Procedure without complication.         Assessment/Plan:  1. Chest pain - hard to say if this is angina - would update his Myoview. Has known CAD.   2. HLD - on statin therapy  3. HTN - BP ok on current regimen  4. AF - rate controlled - on Eliquis - very unhappy with recommendation of continuing which was my recommendation for today. Not interested in other medicines being added such as AAD therapy. Do not get the feeling that he is very symptomatic at all. Will refer to Dr. Rayann Heman to discuss other options and ?Watchman. Would favor continuing his Eliquis for now. Will ask Arthur Gardner to review as well.   Current medicines are reviewed with the patient today.  The patient does not have concerns regarding medicines other than what has been noted above.  The following changes have been made:  See above.  Labs/ tests ordered today include:   No orders of the defined types were placed in this encounter.    Disposition:   Further disposition pending.   Patient is agreeable to this plan and will call if any problems develop in the interim.   Signed: Burtis Junes, RN, ANP-C 02/15/2016 10:19 AM  [facility] [address] [place], [place]  [address] Phone: [phone] Fax: [phone]

## 2016-02-15 NOTE — Telephone Encounter (Signed)
Patient given detailed instructions per Myocardial Perfusion Study Information Sheet for the test on 02/16/16 at 0745. Patient notified to arrive 15 minutes early and that it is imperative to arrive on time for appointment to keep from having the test rescheduled.  If you need to cancel or reschedule your appointment, please call the office within 24 hours of your appointment. Failure to do so may result in a cancellation of your appointment, and a $50 no show fee. Patient verbalized understanding.Shamara Soza, Ranae Palms

## 2016-02-15 NOTE — Patient Instructions (Addendum)
We will be checking the following labs today - CBC and BMET - this is to follow up your Eliquis  Medication Instructions:    Continue with your current medicines.     Testing/Procedures To Be Arranged:  Myoview - Lexiscan  Follow-Up:   See Dr. Rayann Heman for discussion of Atrial fib/Watchman Device     Other Special Instructions:   N/A    If you need a refill on your cardiac medications before your next appointment, please call your pharmacy.   Call the [place] office at [phone] if you have any questions, problems or concerns.

## 2016-02-16 ENCOUNTER — Encounter (HOSPITAL_COMMUNITY): Payer: Self-pay

## 2016-02-16 ENCOUNTER — Telehealth: Payer: Self-pay | Admitting: Nurse Practitioner

## 2016-02-16 ENCOUNTER — Ambulatory Visit (HOSPITAL_COMMUNITY): Payer: Medicare Other | Attending: Cardiovascular Disease

## 2016-02-16 DIAGNOSIS — I482 Chronic atrial fibrillation, unspecified: Secondary | ICD-10-CM

## 2016-02-16 DIAGNOSIS — R0789 Other chest pain: Secondary | ICD-10-CM

## 2016-02-16 DIAGNOSIS — I10 Essential (primary) hypertension: Secondary | ICD-10-CM | POA: Insufficient documentation

## 2016-02-16 DIAGNOSIS — R42 Dizziness and giddiness: Secondary | ICD-10-CM | POA: Insufficient documentation

## 2016-02-16 DIAGNOSIS — I259 Chronic ischemic heart disease, unspecified: Secondary | ICD-10-CM

## 2016-02-16 DIAGNOSIS — Z8249 Family history of ischemic heart disease and other diseases of the circulatory system: Secondary | ICD-10-CM | POA: Insufficient documentation

## 2016-02-16 LAB — MYOCARDIAL PERFUSION IMAGING
Peak HR: 76 {beats}/min
RATE: 0.32
Rest HR: 53 {beats}/min
SDS: 5
SRS: 3
SSS: 8
TID: 0.96

## 2016-02-16 MED ORDER — TECHNETIUM TC 99M TETROFOSMIN IV KIT
10.7000 | PACK | Freq: Once | INTRAVENOUS | Status: AC | PRN
  Administered 2016-02-16: 11 via INTRAVENOUS
  Filled 2016-02-16: qty 11

## 2016-02-16 MED ORDER — TECHNETIUM TC 99M TETROFOSMIN IV KIT
32.1000 | PACK | Freq: Once | INTRAVENOUS | Status: AC | PRN
  Administered 2016-02-16: 32.1 via INTRAVENOUS
  Filled 2016-02-16: qty 32

## 2016-02-16 MED ORDER — REGADENOSON 0.4 MG/5ML IV SOLN
0.4000 mg | Freq: Once | INTRAVENOUS | Status: AC
  Administered 2016-02-16: 0.4 mg via INTRAVENOUS

## 2016-02-16 NOTE — Telephone Encounter (Signed)
Returning your call  .. Thanks  °

## 2016-02-18 DIAGNOSIS — S76211A Strain of adductor muscle, fascia and tendon of right thigh, initial encounter: Secondary | ICD-10-CM | POA: Diagnosis not present

## 2016-02-18 DIAGNOSIS — I482 Chronic atrial fibrillation: Secondary | ICD-10-CM | POA: Diagnosis not present

## 2016-02-18 DIAGNOSIS — I10 Essential (primary) hypertension: Secondary | ICD-10-CM | POA: Diagnosis not present

## 2016-02-18 NOTE — Telephone Encounter (Signed)
Follow Up:    Returning call from [place] his stress test results.

## 2016-02-18 NOTE — Telephone Encounter (Signed)
Pt is aware of labs, stress test results and NP's recommendations. Pt verbalized understanding.

## 2016-03-03 ENCOUNTER — Encounter: Payer: Self-pay | Admitting: Internal Medicine

## 2016-03-21 ENCOUNTER — Encounter: Payer: Self-pay | Admitting: Internal Medicine

## 2016-03-21 ENCOUNTER — Ambulatory Visit (INDEPENDENT_AMBULATORY_CARE_PROVIDER_SITE_OTHER): Payer: Medicare Other | Admitting: Internal Medicine

## 2016-03-21 ENCOUNTER — Encounter (INDEPENDENT_AMBULATORY_CARE_PROVIDER_SITE_OTHER): Payer: Self-pay

## 2016-03-21 VITALS — BP 122/90 | HR 58 | Ht 70.0 in | Wt 211.2 lb

## 2016-03-21 DIAGNOSIS — I482 Chronic atrial fibrillation, unspecified: Secondary | ICD-10-CM

## 2016-03-21 DIAGNOSIS — I4892 Unspecified atrial flutter: Secondary | ICD-10-CM

## 2016-03-21 DIAGNOSIS — I259 Chronic ischemic heart disease, unspecified: Secondary | ICD-10-CM

## 2016-03-21 DIAGNOSIS — I10 Essential (primary) hypertension: Secondary | ICD-10-CM | POA: Diagnosis not present

## 2016-03-21 DIAGNOSIS — I481 Persistent atrial fibrillation: Secondary | ICD-10-CM | POA: Diagnosis not present

## 2016-03-21 DIAGNOSIS — I4819 Other persistent atrial fibrillation: Secondary | ICD-10-CM

## 2016-03-21 NOTE — Progress Notes (Signed)
Electrophysiology Office Note   Date:  03/21/2016   ID:  Arthur Gardner, DOB August 04, 1945, MRN [id number]  PCP:  Shirline Frees, MD  Cardiologist:  Dr Irish Lack Primary Electrophysiologist: Thompson Grayer, MD    Chief Complaint  Patient presents with  . Atrial Fibrillation     History of Present Illness: Arthur Gardner is a 70 y.o. male who presents today for electrophysiology evaluation.   He has persistent afib and previous atrial flutter.  I saw him in 2012.  He was asymptomatic with atrial arrhythmias and opted for no therapy.  He did well until earlier this year when he began noticing palpitations.  He was found to have afib.  He was placed on metoprolol and his symptoms resolved.  He was also placed on eliquis.  He has had no issues with this medicine but does not like being on [facility] therapy.  He underwent recent cardioversion by Dr Harrington Challenger but quickly returned to afib. He feels that exercise tolerance is unchanged in afib.  He is not very active however.  Today, he denies symptoms of chest pain, shortness of breath, orthopnea, PND, lower extremity edema, claudication, dizziness, presyncope, syncope, bleeding, or neurologic sequela. The patient is tolerating medications without difficulties and is otherwise without complaint today.    Past Medical History:  Diagnosis Date  . Atrial flutter ([place])   . BPH (benign prostatic hyperplasia)   . CAD (coronary artery disease)    status post drug-eluting stent to the left anterior descending.  2/10  . Hearing loss    wears hearing aids  . HTN (hypertension)   . Hypercholesteremia   . Peptic ulcer    (remote).   . Persistent atrial fibrillation ([facility])    chads2vasc score is 3  . Sleep apnea    Past Surgical History:  Procedure Laterality Date  . APPENDECTOMY    . CARDIOVERSION N/A 01/14/2016   Procedure: CARDIOVERSION;  Surgeon: Fay Records, MD;  Location: [facility] ENDOSCOPY;  Service: Cardiovascular;  Laterality: N/A;  . HERNIA REPAIR  2000      Current Outpatient Prescriptions  Medication Sig Dispense Refill  . apixaban (ELIQUIS) 5 MG TABS tablet Take 1 tablet (5 mg total) by mouth 2 (two) times daily. 60 tablet 3  . aspirin 81 MG tablet Take 81 mg by mouth daily.    . Coenzyme Q10 (COQ-10 PO) Take 200 mg by mouth daily.     . Fish Oil OIL Take 2,000 mg by mouth 2 (two) times daily.     Marland Kitchen lisinopril (PRINIVIL,ZESTRIL) 5 MG tablet Take 1 tablet (5 mg total) by mouth daily. 90 tablet 3  . metoprolol tartrate (LOPRESSOR) 25 MG tablet Take 1 tablet (25 mg total) by mouth daily. 90 tablet 3  . simvastatin (ZOCOR) 40 MG tablet Take 1 tablet (40 mg total) by mouth daily. 90 tablet 3   No current facility-administered medications for this visit.     Allergies:   Other   Social History:  The patient  reports that he has quit smoking. He has never used smokeless tobacco. He reports that he drinks alcohol. He reports that he does not use drugs.   Family History:  The patient's  family history includes Heart disease in his father; Hypertension in his mother.    ROS:  Please see the history of present illness.   All other systems are reviewed and negative.    PHYSICAL EXAM: VS:  BP 122/90   Pulse (!) 58  Ht 5\' 10"  (1.778 m)   Wt 211 lb 3.2 oz (95.8 kg)   BMI 30.30 kg/m  , BMI Body mass index is 30.3 kg/m. GEN: Well nourished, well developed, in no acute distress  HEENT: normal  Neck: no JVD, carotid bruits, or masses Cardiac: iRRR; no murmurs, rubs, or gallops,no edema  Respiratory:  clear to auscultation bilaterally, normal work of breathing GI: soft, nontender, nondistended, + BS MS: no deformity or atrophy  Skin: warm and dry  Neuro:  Strength and sensation are intact Psych: euthymic mood, full affect  EKG:  EKG is ordered today. The ekg ordered today shows afib, V rate 58 bpm   Recent Labs: 02/15/2016: BUN 17; Creat 1.01; Hemoglobin 15.3; Platelets 153; Potassium 4.7; Sodium 140    Lipid Panel      Component Value Date/Time   CHOL 127 03/10/2015 0829   TRIG 87.0 03/10/2015 0829   HDL 34.50 (L) 03/10/2015 0829   CHOLHDL 4 03/10/2015 0829   VLDL 17.4 03/10/2015 0829   LDLCALC 76 03/10/2015 0829     Wt Readings from Last 3 Encounters:  03/21/16 211 lb 3.2 oz (95.8 kg)  02/15/16 206 lb 12.8 oz (93.8 kg)  01/14/16 206 lb (93.4 kg)      Other studies Reviewed: Additional studies/ records that were reviewed today include: Lori and Dr Hassell Done note, recent cardioversion, echo  Review of the above records today demonstrates: preserved EF, no significant valvular disease, mild biatrial enlargement   ASSESSMENT AND PLAN:  1.  Persistent afib The patient has minimally symptomatic afib.  He appears to be doing very well with rate control.  His chadsd2score is at least 3.  I have strongly advised that he continue long term [place] therapy.  He may benefit from stopping ASA.  I will defer this discussion to Dr Irish Lack.   We discussed AVERROES and ARISTOTLE data as well as real world anticoagulation data at length today.  We also discussed Watchman as an alternative to anticoagulation however as he is stable on eliquis, I have advised that we continue this medicine for now.   We discussed tikosyn as an option for rhythm control.  Presently, he is not interested in this medicine and would prefer rate control going forward.  I would defer ablation for AF with symptoms refractory to AAD therapy as per AHA guidelines.  2. CAD No ischemic symptoms Consider stopping ASA Will defer to Dr Irish Lack  3. HTN Stable No change required today  4. OSA Adequate treatment is advised   Follow-up with Dr Irish Lack in 2 months I will see as needed going forward  Today, I have spent 40 minutes with the patient discussing afib management .  More than 50% of the visit time today was spent on this issue.  Current medicines are reviewed at length with the patient today.   The patient does not have  concerns regarding his medicines.  The following changes were made today:  none  Labs/ tests ordered today include:   No orders of the defined types were placed in this encounter.    Army Fossa, MD  03/21/2016 9:58 AM     [facility] [address]  [place] [address] [phone] (office) [phone] (fax)

## 2016-03-21 NOTE — Patient Instructions (Signed)
Medication Instructions:  Your physician recommends that you continue on your current medications as directed. Please refer to the Current Medication list given to you today.   Labwork: None ordered   Testing/Procedures: None ordered   Follow-Up: Your physician recommends that you schedule a follow-up appointment in: 2 months with Dr Irish Lack   Any Other Special Instructions Will Be Listed Below (If Applicable).     If you need a refill on your cardiac medications before your next appointment, please call your pharmacy.

## 2016-04-19 ENCOUNTER — Other Ambulatory Visit: Payer: Self-pay | Admitting: Interventional Cardiology

## 2016-05-12 DIAGNOSIS — H04123 Dry eye syndrome of bilateral lacrimal glands: Secondary | ICD-10-CM | POA: Diagnosis not present

## 2016-05-12 DIAGNOSIS — H40013 Open angle with borderline findings, low risk, bilateral: Secondary | ICD-10-CM | POA: Diagnosis not present

## 2016-05-22 NOTE — Progress Notes (Signed)
Cardiology Office Note   Date:  05/24/2016   ID:  Arthur Gardner, DOB [birth year], MRN [id number]  PCP:  Shirline Frees, MD    No chief complaint on file.    Wt Readings from Last 3 Encounters:  05/24/16 95.6 kg (210 lb 12.8 oz)  03/21/16 95.8 kg (211 lb 3.2 oz)  02/15/16 93.8 kg (206 lb 12.8 oz)       History of Present Illness: Arthur Gardner is a 70 y.o. male  who has had a DES to the LAD in 3/10.  He had paroxysmal atrial flutter in the past but this was medically managed.  He was diagnosed with AFib.  He had a cardioversion but reverted back to AFib quickly.  He saw EP.  He is going to be managed with rate control and anticoagulation.    He continues to walk.  No CP or SHOB. He denies any syncope or lightheadedness.  No NTG use.    No significant bleeding problems. No blood in stool.  Mild bruising.  Negative nuclear stress test in 8/17.      Past Medical History:  Diagnosis Date  . Atrial flutter ([place])   . BPH (benign prostatic hyperplasia)   . CAD (coronary artery disease)    status post drug-eluting stent to the left anterior descending.  2/10  . Hearing loss    wears hearing aids  . HTN (hypertension)   . Hypercholesteremia   . Peptic ulcer    (remote).   . Persistent atrial fibrillation (HCC)    chads2vasc score is 3  . Sleep apnea     Past Surgical History:  Procedure Laterality Date  . APPENDECTOMY    . CARDIOVERSION N/A 01/14/2016   Procedure: CARDIOVERSION;  Surgeon: Fay Records, MD;  Location: [facility] ENDOSCOPY;  Service: Cardiovascular;  Laterality: N/A;  . HERNIA REPAIR  2000     Current Outpatient Prescriptions  Medication Sig Dispense Refill  . aspirin 81 MG tablet Take 81 mg by mouth daily.    . Coenzyme Q10 (COQ-10 PO) Take 200 mg by mouth daily.     Marland Kitchen ELIQUIS 5 MG TABS tablet TAKE 1 TABLET(5 MG) BY MOUTH TWICE DAILY 60 tablet 5  . Fish Oil OIL Take 2,000 mg by mouth 2 (two) times daily.     Marland Kitchen lisinopril (PRINIVIL,ZESTRIL) 5 MG tablet  Take 1 tablet (5 mg total) by mouth daily. 90 tablet 3  . metoprolol tartrate (LOPRESSOR) 25 MG tablet Take 1 tablet (25 mg total) by mouth daily. 90 tablet 3  . simvastatin (ZOCOR) 40 MG tablet TAKE 1 TABLET(40 MG) BY MOUTH DAILY 90 tablet 1   No current facility-administered medications for this visit.     Allergies:   Doxycycline hyclate and Other    Social History:  The patient  reports that he has quit smoking. He has never used smokeless tobacco. He reports that he drinks alcohol. He reports that he does not use drugs.   Family History:  The patient's family history includes Heart disease in his father; Hypertension in his mother.    ROS:  Please see the history of present illness.   Otherwise, review of systems are positive for easy bruising.   All other systems are reviewed and negative.    PHYSICAL EXAM: VS:  BP 116/80   Pulse 60   Ht 5\' 10"  (1.778 m)   Wt 95.6 kg (210 lb 12.8 oz)   SpO2 98%   BMI 30.25 kg/m  ,  BMI Body mass index is 30.25 kg/m. GEN: Well nourished, well developed, in no acute distress  HEENT: normal  Neck: no JVD, carotid bruits, or masses Cardiac: irregularly irregular; no murmurs, rubs, or gallops,no edema  Respiratory:  clear to auscultation bilaterally, normal work of breathing GI: soft, nontender, nondistended, + BS MS: no deformity or atrophy  Skin: warm and dry, no rash Neuro:  Strength and sensation are intact Psych: euthymic mood, full affect    Recent Labs: 02/15/2016: BUN 17; Creat 1.01; Hemoglobin 15.3; Platelets 153; Potassium 4.7; Sodium 140   Lipid Panel    Component Value Date/Time   CHOL 127 03/10/2015 0829   TRIG 87.0 03/10/2015 0829   HDL 34.50 (L) 03/10/2015 0829   CHOLHDL 4 03/10/2015 0829   VLDL 17.4 03/10/2015 0829   LDLCALC 76 03/10/2015 0829     Other studies Reviewed: Additional studies/ records that were reviewed today with results demonstrating: EP notes.   ASSESSMENT AND PLAN:  1. CAD: No angina.  Continue aggressive secondary prevention.  S/p LAD stent, DES in 2010. 2. Mitral regurgitation: Prior MVP with eccentric MR in 2012.Marland Kitchen Checked LV funtion in 2014: normal LV function; mild eccentric MR, posteriorly directed (anterior prolapse).  Only trivial regurgitation noted on the 12/2015 echo.  Normal LV function. 3. Atrial fib/flutter:  COntinue Eliquis for stroke prevention.  Rate controlled. CHADS-vasc score of 2.  No signs of bradycardia. Now in Fib.  Flutter noted in the past. 4. Avoid NSAIDs.  Would use tylenol for pains if needed.  5. Hyperlipidemia: TO be checked with Dr. Kenton Kingfisher in 12/17.  Last check was well controlled.    Current medicines are reviewed at length with the patient today.  The patient concerns regarding his medicines were addressed.  The following changes have been made:  No change  Labs/ tests ordered today include:  No orders of the defined types were placed in this encounter.   Recommend 150 minutes/week of aerobic exercise Low fat, low carb, high fiber diet recommended  Disposition:   FU in 1 year   Signed, Larae Grooms, MD  05/24/2016 10:25 AM    [facility] [place], [place], [place]  [address] Phone: [phone]; Fax: [phone]

## 2016-05-24 ENCOUNTER — Ambulatory Visit (INDEPENDENT_AMBULATORY_CARE_PROVIDER_SITE_OTHER): Payer: Medicare Other | Admitting: Interventional Cardiology

## 2016-05-24 ENCOUNTER — Encounter: Payer: Self-pay | Admitting: Interventional Cardiology

## 2016-05-24 ENCOUNTER — Encounter (INDEPENDENT_AMBULATORY_CARE_PROVIDER_SITE_OTHER): Payer: Self-pay

## 2016-05-24 VITALS — BP 116/80 | HR 60 | Ht 70.0 in | Wt 210.8 lb

## 2016-05-24 DIAGNOSIS — I2510 Atherosclerotic heart disease of native coronary artery without angina pectoris: Secondary | ICD-10-CM | POA: Diagnosis not present

## 2016-05-24 DIAGNOSIS — E782 Mixed hyperlipidemia: Secondary | ICD-10-CM | POA: Diagnosis not present

## 2016-05-24 DIAGNOSIS — I259 Chronic ischemic heart disease, unspecified: Secondary | ICD-10-CM | POA: Diagnosis not present

## 2016-05-24 DIAGNOSIS — I4892 Unspecified atrial flutter: Secondary | ICD-10-CM

## 2016-05-24 DIAGNOSIS — I4819 Other persistent atrial fibrillation: Secondary | ICD-10-CM

## 2016-05-24 DIAGNOSIS — I481 Persistent atrial fibrillation: Secondary | ICD-10-CM | POA: Diagnosis not present

## 2016-05-24 NOTE — Patient Instructions (Signed)

## 2016-06-06 ENCOUNTER — Encounter: Payer: Self-pay | Admitting: Interventional Cardiology

## 2016-06-06 DIAGNOSIS — D691 Qualitative platelet defects: Secondary | ICD-10-CM | POA: Diagnosis not present

## 2016-06-06 DIAGNOSIS — R7301 Impaired fasting glucose: Secondary | ICD-10-CM | POA: Diagnosis not present

## 2016-06-06 DIAGNOSIS — Z23 Encounter for immunization: Secondary | ICD-10-CM | POA: Diagnosis not present

## 2016-06-06 DIAGNOSIS — E7800 Pure hypercholesterolemia, unspecified: Secondary | ICD-10-CM | POA: Diagnosis not present

## 2016-06-06 DIAGNOSIS — G4733 Obstructive sleep apnea (adult) (pediatric): Secondary | ICD-10-CM | POA: Diagnosis not present

## 2016-06-06 DIAGNOSIS — Z0000 Encounter for general adult medical examination without abnormal findings: Secondary | ICD-10-CM | POA: Diagnosis not present

## 2016-06-06 DIAGNOSIS — I10 Essential (primary) hypertension: Secondary | ICD-10-CM | POA: Diagnosis not present

## 2016-06-06 DIAGNOSIS — I482 Chronic atrial fibrillation: Secondary | ICD-10-CM | POA: Diagnosis not present

## 2016-06-06 DIAGNOSIS — Z125 Encounter for screening for malignant neoplasm of prostate: Secondary | ICD-10-CM | POA: Diagnosis not present

## 2016-06-06 DIAGNOSIS — B356 Tinea cruris: Secondary | ICD-10-CM | POA: Diagnosis not present

## 2016-06-30 DIAGNOSIS — D492 Neoplasm of unspecified behavior of bone, soft tissue, and skin: Secondary | ICD-10-CM | POA: Diagnosis not present

## 2016-06-30 DIAGNOSIS — L570 Actinic keratosis: Secondary | ICD-10-CM | POA: Diagnosis not present

## 2016-06-30 DIAGNOSIS — L910 Hypertrophic scar: Secondary | ICD-10-CM | POA: Diagnosis not present

## 2016-07-07 ENCOUNTER — Other Ambulatory Visit: Payer: Self-pay | Admitting: *Deleted

## 2016-07-07 MED ORDER — METOPROLOL TARTRATE 25 MG PO TABS: 25.0000 mg | ORAL_TABLET | Freq: Every day | ORAL | 3 refills | Status: DC

## 2016-07-07 MED ORDER — APIXABAN 5 MG PO TABS: ORAL_TABLET | ORAL | 3 refills | Status: DC

## 2016-07-07 MED ORDER — SIMVASTATIN 40 MG PO TABS: ORAL_TABLET | ORAL | 3 refills | Status: DC

## 2016-07-07 MED ORDER — LISINOPRIL 5 MG PO TABS: 5.0000 mg | ORAL_TABLET | Freq: Every day | ORAL | 3 refills | Status: DC

## 2016-08-03 DIAGNOSIS — G4733 Obstructive sleep apnea (adult) (pediatric): Secondary | ICD-10-CM | POA: Diagnosis not present

## 2016-09-12 DIAGNOSIS — G4733 Obstructive sleep apnea (adult) (pediatric): Secondary | ICD-10-CM | POA: Diagnosis not present

## 2016-10-26 DIAGNOSIS — H903 Sensorineural hearing loss, bilateral: Secondary | ICD-10-CM | POA: Diagnosis not present

## 2016-12-20 DIAGNOSIS — L570 Actinic keratosis: Secondary | ICD-10-CM | POA: Diagnosis not present

## 2016-12-20 DIAGNOSIS — L304 Erythema intertrigo: Secondary | ICD-10-CM | POA: Diagnosis not present

## 2016-12-20 DIAGNOSIS — L723 Sebaceous cyst: Secondary | ICD-10-CM | POA: Diagnosis not present

## 2017-01-16 DIAGNOSIS — H40013 Open angle with borderline findings, low risk, bilateral: Secondary | ICD-10-CM | POA: Diagnosis not present

## 2017-01-16 DIAGNOSIS — H2513 Age-related nuclear cataract, bilateral: Secondary | ICD-10-CM | POA: Diagnosis not present

## 2017-01-16 DIAGNOSIS — H25013 Cortical age-related cataract, bilateral: Secondary | ICD-10-CM | POA: Diagnosis not present

## 2017-03-21 DIAGNOSIS — G4733 Obstructive sleep apnea (adult) (pediatric): Secondary | ICD-10-CM | POA: Diagnosis not present

## 2017-06-04 NOTE — Progress Notes (Signed)
Cardiology Office Note   Date:  06/06/2017   ID:  Arthur Gardner, DOB 1946-02-21, MRN [id number]  PCP:  Shirline Frees, MD    No chief complaint on file. CAD, AFib   Wt Readings from Last 3 Encounters:  06/06/17 213 lb 3.2 oz (96.7 kg)  05/24/16 210 lb 12.8 oz (95.6 kg)  03/21/16 211 lb 3.2 oz (95.8 kg)       History of Present Illness: Arthur Gardner is a 71 y.o. male  who has had a DES to the LAD in 3/10.  He had paroxysmal atrial flutter in the past but this was medically managed.  He was diagnosed with AFib.  He had a cardioversion but reverted back to AFib quickly.  He saw EP.  He is going to be managed with rate control and anticoagulation.    Negative nuclear stress test in 8/17.  Denies : Chest pain. Dizziness. Leg edema. Nitroglycerin use. Orthopnea. Palpitations. Paroxysmal nocturnal dyspnea. Shortness of breath. Syncope.   Reports occasional nuisance bleeding.    He walks around his lot several times a day.  It is a 3 acre lot.  He does not push himself to the point of being winded.    Past Medical History:  Diagnosis Date  . Atrial flutter ([place])   . BPH (benign prostatic hyperplasia)   . CAD (coronary artery disease)    status post drug-eluting stent to the left anterior descending.  2/10  . Hearing loss    wears hearing aids  . HTN (hypertension)   . Hypercholesteremia   . Peptic ulcer    (remote).   . Persistent atrial fibrillation (HCC)    chads2vasc score is 3  . Sleep apnea     Past Surgical History:  Procedure Laterality Date  . APPENDECTOMY    . CARDIOVERSION N/A 01/14/2016   Procedure: CARDIOVERSION;  Surgeon: Fay Records, MD;  Location: [facility] ENDOSCOPY;  Service: Cardiovascular;  Laterality: N/A;  . HERNIA REPAIR  2000     Current Outpatient Medications  Medication Sig Dispense Refill  . apixaban (ELIQUIS) 5 MG TABS tablet TAKE 1 TABLET(5 MG) BY MOUTH TWICE DAILY 180 tablet 3  . aspirin 81 MG tablet Take 81 mg by mouth daily.    .  Coenzyme Q10 (COQ-10 PO) Take 200 mg by mouth daily.     . Fish Oil OIL Take 2,000 mg by mouth 2 (two) times daily.     Marland Kitchen lisinopril (PRINIVIL,ZESTRIL) 5 MG tablet Take 1 tablet (5 mg total) by mouth daily. 90 tablet 3  . metoprolol tartrate (LOPRESSOR) 25 MG tablet Take 1 tablet (25 mg total) by mouth daily. 90 tablet 3  . simvastatin (ZOCOR) 40 MG tablet TAKE 1 TABLET(40 MG) BY MOUTH DAILY 90 tablet 3   No current facility-administered medications for this visit.     Allergies:   Doxycycline hyclate and Other    Social History:  The patient  reports that he has quit smoking. he has never used smokeless tobacco. He reports that he drinks alcohol. He reports that he does not use drugs.   Family History:  The patient's family history includes Heart disease in his father; Hypertension in his mother.    ROS:  Please see the history of present illness.   Otherwise, review of systems are positive for nuisance bleeding.   All other systems are reviewed and negative.    PHYSICAL EXAM: VS:  BP 106/70   Pulse 67   Ht  5\' 10"  (1.778 m)   Wt 213 lb 3.2 oz (96.7 kg)   SpO2 96%   BMI 30.59 kg/m  , BMI Body mass index is 30.59 kg/m. GEN: Well nourished, well developed, in no acute distress  HEENT: normal  Neck: no JVD, carotid bruits, or masses Cardiac: irregularly irregular; soft systolicmurmur, no rubs, or gallops,no edema  Respiratory:  clear to auscultation bilaterally, normal work of breathing GI: soft, nontender, nondistended, + BS MS: no deformity or atrophy  Skin: warm and dry, no rash Neuro:  Strength and sensation are intact Psych: euthymic mood, full affect   EKG:   The ekg ordered today demonstrates AFib, rate controlled   Recent Labs: No results found for requested labs within last 8760 hours.   Lipid Panel    Component Value Date/Time   CHOL 127 03/10/2015 0829   TRIG 87.0 03/10/2015 0829   HDL 34.50 (L) 03/10/2015 0829   CHOLHDL 4 03/10/2015 0829   VLDL 17.4  03/10/2015 0829   LDLCALC 76 03/10/2015 0829     Other studies Reviewed: Additional studies/ records that were reviewed today with results demonstrating: .   ASSESSMENT AND PLAN:  1. CAD: s/p LAD DES in 2010. No angina on medical therapy. Will stop aspirin given that he is on Eliquis, to reduce bleeding risk.  2. Mitral regurgitation: Trivial in 2017.  Had been more significant in 2012. No signs of CHF. 3. AFib/flutter: Eliquis for stroke prevention.  He is well rate controlled.  Check CBC and electrolytes today. 4. Hyperlipidemia: Checking lipids today.  Continue lipid lowering therapy.     Current medicines are reviewed at length with the patient today.  The patient concerns regarding his medicines were addressed.  The following changes have been made:  No change  Labs/ tests ordered today include:  No orders of the defined types were placed in this encounter.   Recommend 150 minutes/week of aerobic exercise Low fat, low carb, high fiber diet recommended  Disposition:   FU in 1 year   Signed, Larae Grooms, MD  06/06/2017 10:49 AM    [facility] [place], [place], [place]  [address] Phone: [phone]; Fax: [phone]

## 2017-06-06 ENCOUNTER — Encounter: Payer: Self-pay | Admitting: Interventional Cardiology

## 2017-06-06 ENCOUNTER — Ambulatory Visit: Payer: PPO | Admitting: Interventional Cardiology

## 2017-06-06 VITALS — BP 106/70 | HR 67 | Ht 70.0 in | Wt 213.2 lb

## 2017-06-06 DIAGNOSIS — I4891 Unspecified atrial fibrillation: Secondary | ICD-10-CM

## 2017-06-06 DIAGNOSIS — I2510 Atherosclerotic heart disease of native coronary artery without angina pectoris: Secondary | ICD-10-CM | POA: Diagnosis not present

## 2017-06-06 DIAGNOSIS — I4892 Unspecified atrial flutter: Secondary | ICD-10-CM

## 2017-06-06 DIAGNOSIS — E785 Hyperlipidemia, unspecified: Secondary | ICD-10-CM

## 2017-06-06 LAB — COMPREHENSIVE METABOLIC PANEL
A/G RATIO: 1.6 (ref 1.2–2.2)
ALBUMIN: 4.3 g/dL (ref 3.5–4.8)
ALK PHOS: 72 IU/L (ref 39–117)
ALT: 21 IU/L (ref 0–44)
AST: 23 IU/L (ref 0–40)
BILIRUBIN TOTAL: 0.9 mg/dL (ref 0.0–1.2)
BUN / CREAT RATIO: 16 (ref 10–24)
BUN: 18 mg/dL (ref 8–27)
CHLORIDE: 102 mmol/L (ref 96–106)
CO2: 25 mmol/L (ref 20–29)
Calcium: 9.2 mg/dL (ref 8.6–10.2)
Creatinine, Ser: 1.1 mg/dL (ref 0.76–1.27)
GFR calc Af Amer: 77 mL/min/{1.73_m2} (ref >59)
GFR calc non Af Amer: 66 mL/min/{1.73_m2} (ref >59)
GLOBULIN, TOTAL: 2.7 g/dL (ref 1.5–4.5)
Glucose: 91 mg/dL (ref 65–99)
Potassium: 4.7 mmol/L (ref 3.5–5.2)
SODIUM: 140 mmol/L (ref 134–144)
Total Protein: 7 g/dL (ref 6.0–8.5)

## 2017-06-06 LAB — LIPID PANEL
Chol/HDL Ratio: 4 ratio (ref 0.0–5.0)
Cholesterol, Total: 140 mg/dL (ref 100–199)
HDL: 35 mg/dL — ABNORMAL LOW (ref >39)
LDL Calculated: 82 mg/dL (ref 0–99)
Triglycerides: 113 mg/dL (ref 0–149)
VLDL Cholesterol Cal: 23 mg/dL (ref 5–40)

## 2017-06-06 LAB — CBC
HEMATOCRIT: 43.4 % (ref 37.5–51.0)
HEMOGLOBIN: 15 g/dL (ref 13.0–17.7)
MCH: 31.3 pg (ref 26.6–33.0)
MCHC: 34.6 g/dL (ref 31.5–35.7)
MCV: 90 fL (ref 79–97)
Platelets: 154 10*3/uL (ref 150–379)
RBC: 4.8 x10E6/uL (ref 4.14–5.80)
RDW: 12.7 % (ref 12.3–15.4)
WBC: 7.6 10*3/uL (ref 3.4–10.8)

## 2017-06-06 MED ORDER — METOPROLOL TARTRATE 25 MG PO TABS: 25.0000 mg | ORAL_TABLET | Freq: Every day | ORAL | 3 refills | Status: DC

## 2017-06-06 MED ORDER — SIMVASTATIN 40 MG PO TABS: ORAL_TABLET | ORAL | 3 refills | Status: DC

## 2017-06-06 MED ORDER — LISINOPRIL 5 MG PO TABS: 5.0000 mg | ORAL_TABLET | Freq: Every day | ORAL | 3 refills | Status: DC

## 2017-06-06 NOTE — Patient Instructions (Signed)
Medication Instructions:  Your physician has recommended you make the following change in your medication:   1. STOP Aspirin  Labwork: TODAY: CBC, LIPIDS, CMET  Testing/Procedures: None ordered  Follow-Up: Your physician wants you to follow-up in: 1 year with Dr. Irish Lack. You will receive a reminder letter in the mail two months in advance. If you don't receive a letter, please call our office to schedule the follow-up appointment.   Any Other Special Instructions Will Be Listed Below (If Applicable).     If you need a refill on your cardiac medications before your next appointment, please call your pharmacy.

## 2017-06-07 DIAGNOSIS — R7303 Prediabetes: Secondary | ICD-10-CM | POA: Diagnosis not present

## 2017-06-07 DIAGNOSIS — I10 Essential (primary) hypertension: Secondary | ICD-10-CM | POA: Diagnosis not present

## 2017-06-07 DIAGNOSIS — I2510 Atherosclerotic heart disease of native coronary artery without angina pectoris: Secondary | ICD-10-CM | POA: Diagnosis not present

## 2017-06-07 DIAGNOSIS — Z0000 Encounter for general adult medical examination without abnormal findings: Secondary | ICD-10-CM | POA: Diagnosis not present

## 2017-06-07 DIAGNOSIS — Z125 Encounter for screening for malignant neoplasm of prostate: Secondary | ICD-10-CM | POA: Diagnosis not present

## 2017-06-07 DIAGNOSIS — E7800 Pure hypercholesterolemia, unspecified: Secondary | ICD-10-CM | POA: Diagnosis not present

## 2017-06-07 DIAGNOSIS — G4733 Obstructive sleep apnea (adult) (pediatric): Secondary | ICD-10-CM | POA: Diagnosis not present

## 2017-06-07 DIAGNOSIS — I481 Persistent atrial fibrillation: Secondary | ICD-10-CM | POA: Diagnosis not present

## 2017-07-05 ENCOUNTER — Other Ambulatory Visit: Payer: Self-pay

## 2017-07-05 MED ORDER — APIXABAN 5 MG PO TABS: ORAL_TABLET | ORAL | 3 refills | Status: DC

## 2017-07-05 NOTE — Telephone Encounter (Signed)
Pt last saw Dr Irish Lack 06/06/17, last labs 06/06/17 Creat 1.10, age 72, weight 96.7kg, based on CrCl pt is on appropriate dosage of Eliquis 5mg  BID.  Will refill rx.

## 2017-07-27 DIAGNOSIS — H903 Sensorineural hearing loss, bilateral: Secondary | ICD-10-CM | POA: Diagnosis not present

## 2017-07-31 DIAGNOSIS — G4733 Obstructive sleep apnea (adult) (pediatric): Secondary | ICD-10-CM | POA: Diagnosis not present

## 2017-08-04 DIAGNOSIS — H903 Sensorineural hearing loss, bilateral: Secondary | ICD-10-CM | POA: Diagnosis not present

## 2017-09-15 DIAGNOSIS — L723 Sebaceous cyst: Secondary | ICD-10-CM | POA: Diagnosis not present

## 2017-12-08 DIAGNOSIS — I482 Chronic atrial fibrillation: Secondary | ICD-10-CM | POA: Diagnosis not present

## 2017-12-08 DIAGNOSIS — R7303 Prediabetes: Secondary | ICD-10-CM | POA: Diagnosis not present

## 2017-12-08 DIAGNOSIS — I481 Persistent atrial fibrillation: Secondary | ICD-10-CM | POA: Diagnosis not present

## 2017-12-08 DIAGNOSIS — E7800 Pure hypercholesterolemia, unspecified: Secondary | ICD-10-CM | POA: Diagnosis not present

## 2017-12-08 DIAGNOSIS — G4733 Obstructive sleep apnea (adult) (pediatric): Secondary | ICD-10-CM | POA: Diagnosis not present

## 2017-12-08 DIAGNOSIS — I10 Essential (primary) hypertension: Secondary | ICD-10-CM | POA: Diagnosis not present

## 2017-12-08 DIAGNOSIS — I2510 Atherosclerotic heart disease of native coronary artery without angina pectoris: Secondary | ICD-10-CM | POA: Diagnosis not present

## 2018-01-18 DIAGNOSIS — H2513 Age-related nuclear cataract, bilateral: Secondary | ICD-10-CM | POA: Diagnosis not present

## 2018-01-18 DIAGNOSIS — H25013 Cortical age-related cataract, bilateral: Secondary | ICD-10-CM | POA: Diagnosis not present

## 2018-01-18 DIAGNOSIS — H40013 Open angle with borderline findings, low risk, bilateral: Secondary | ICD-10-CM | POA: Diagnosis not present

## 2018-02-05 DIAGNOSIS — G4733 Obstructive sleep apnea (adult) (pediatric): Secondary | ICD-10-CM | POA: Diagnosis not present

## 2018-03-27 DIAGNOSIS — D485 Neoplasm of uncertain behavior of skin: Secondary | ICD-10-CM | POA: Diagnosis not present

## 2018-03-27 DIAGNOSIS — D044 Carcinoma in situ of skin of scalp and neck: Secondary | ICD-10-CM | POA: Diagnosis not present

## 2018-03-27 DIAGNOSIS — L570 Actinic keratosis: Secondary | ICD-10-CM | POA: Diagnosis not present

## 2018-03-27 DIAGNOSIS — C4442 Squamous cell carcinoma of skin of scalp and neck: Secondary | ICD-10-CM | POA: Diagnosis not present

## 2018-04-23 IMAGING — NM NM MISC PROCEDURE
2 series · 12 of 12 positions shown · non-contrast
Comparison: none

[Series 1: stress_(id)_sa · 6.4mm · 6.40mm/px · 6 of 64 frames shown]
[frame 6/64]
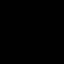
[frame 16/64]
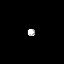
[frame 27/64]
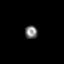
[frame 38/64]
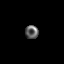
[frame 48/64]
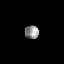
[frame 59/64]
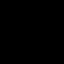

[Series 1: rest_(id)_sa · 6.4mm · 6.40mm/px · 6 of 64 frames shown]
[frame 6/64]
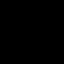
[frame 16/64]
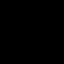
[frame 27/64]
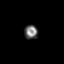
[frame 38/64]
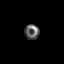
[frame 48/64]
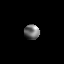
[frame 59/64]
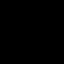

[12 of 12 positions shown; findings below may reference images not displayed]

Canned report from images found in remote index.

Refer to host system for actual result text.

## 2018-05-07 DIAGNOSIS — G4733 Obstructive sleep apnea (adult) (pediatric): Secondary | ICD-10-CM | POA: Diagnosis not present

## 2018-05-17 DIAGNOSIS — C4442 Squamous cell carcinoma of skin of scalp and neck: Secondary | ICD-10-CM | POA: Diagnosis not present

## 2018-05-17 DIAGNOSIS — D044 Carcinoma in situ of skin of scalp and neck: Secondary | ICD-10-CM | POA: Diagnosis not present

## 2018-05-28 DIAGNOSIS — G4733 Obstructive sleep apnea (adult) (pediatric): Secondary | ICD-10-CM | POA: Diagnosis not present

## 2018-05-28 DIAGNOSIS — G4731 Primary central sleep apnea: Secondary | ICD-10-CM | POA: Diagnosis not present

## 2018-06-11 DIAGNOSIS — Z125 Encounter for screening for malignant neoplasm of prostate: Secondary | ICD-10-CM | POA: Diagnosis not present

## 2018-06-11 DIAGNOSIS — Z0000 Encounter for general adult medical examination without abnormal findings: Secondary | ICD-10-CM | POA: Diagnosis not present

## 2018-06-11 DIAGNOSIS — I4891 Unspecified atrial fibrillation: Secondary | ICD-10-CM | POA: Diagnosis not present

## 2018-06-11 DIAGNOSIS — I2510 Atherosclerotic heart disease of native coronary artery without angina pectoris: Secondary | ICD-10-CM | POA: Diagnosis not present

## 2018-06-11 DIAGNOSIS — R7303 Prediabetes: Secondary | ICD-10-CM | POA: Diagnosis not present

## 2018-06-11 DIAGNOSIS — I10 Essential (primary) hypertension: Secondary | ICD-10-CM | POA: Diagnosis not present

## 2018-06-11 DIAGNOSIS — E7800 Pure hypercholesterolemia, unspecified: Secondary | ICD-10-CM | POA: Diagnosis not present

## 2018-06-11 DIAGNOSIS — G4733 Obstructive sleep apnea (adult) (pediatric): Secondary | ICD-10-CM | POA: Diagnosis not present

## 2018-06-27 DIAGNOSIS — G4733 Obstructive sleep apnea (adult) (pediatric): Secondary | ICD-10-CM | POA: Diagnosis not present

## 2018-06-27 DIAGNOSIS — G4731 Primary central sleep apnea: Secondary | ICD-10-CM | POA: Diagnosis not present

## 2018-07-16 NOTE — Progress Notes (Signed)
Cardiology Office Note   Date:  07/17/2018   ID:  Arthur Gardner, DOB 1945-09-17, MRN [id number]  PCP:  Shirline Frees, MD    No chief complaint on file.  CAD/AFib  Wt Readings from Last 3 Encounters:  07/17/18 217 lb 1.9 oz (98.5 kg)  06/06/17 213 lb 3.2 oz (96.7 kg)  05/24/16 210 lb 12.8 oz (95.6 kg)       History of Present Illness: Arthur Gardner is a 73 y.o. male   who has had a DES to the LAD in 3/10.He had paroxysmal atrial flutter in the past but this was medically managed.He was diagnosed with AFib. He had a cardioversion but reverted back to AFib quickly. He saw EP. He is going to be managed with rate control and anticoagulation.   Negative nuclear stress test in 8/17.  Since the last visit, he feels well.  He walks 3x/day, up to 1 mile at a time.  Denies : Chest pain. Dizziness. Leg edema. Nitroglycerin use. Orthopnea. Palpitations. Paroxysmal nocturnal dyspnea. Shortness of breath. Syncope.   No bleeding problems.   BP controlled at home.    Some dietary indiscretions.     Past Medical History:  Diagnosis Date  . Atrial flutter ([place])   . BPH (benign prostatic hyperplasia)   . CAD (coronary artery disease)    status post drug-eluting stent to the left anterior descending.  2/10  . Hearing loss    wears hearing aids  . HTN (hypertension)   . Hypercholesteremia   . Peptic ulcer    (remote).   . Persistent atrial fibrillation    chads2vasc score is 3  . Sleep apnea     Past Surgical History:  Procedure Laterality Date  . APPENDECTOMY    . CARDIOVERSION N/A 01/14/2016   Procedure: CARDIOVERSION;  Surgeon: Fay Records, MD;  Location: [facility] ENDOSCOPY;  Service: Cardiovascular;  Laterality: N/A;  . HERNIA REPAIR  2000     Current Outpatient Medications  Medication Sig Dispense Refill  . apixaban (ELIQUIS) 5 MG TABS tablet TAKE 1 TABLET(5 MG) BY MOUTH TWICE DAILY 180 tablet 3  . Coenzyme Q10 (COQ-10 PO) Take 200 mg by mouth daily.     . Fish  Oil OIL Take 2,000 mg by mouth 2 (two) times daily.     Marland Kitchen lisinopril (PRINIVIL,ZESTRIL) 5 MG tablet Take 1 tablet (5 mg total) by mouth daily. 90 tablet 3  . metoprolol tartrate (LOPRESSOR) 25 MG tablet Take 1 tablet (25 mg total) by mouth daily. 90 tablet 3  . simvastatin (ZOCOR) 40 MG tablet TAKE 1 TABLET(40 MG) BY MOUTH DAILY 90 tablet 3   No current facility-administered medications for this visit.     Allergies:   Doxycycline hyclate and Other    Social History:  The patient  reports that he has quit smoking. He has never used smokeless tobacco. He reports current alcohol use. He reports that he does not use drugs.   Family History:  The patient's family history includes Heart disease in his father; Hypertension in his mother.    ROS:  Please see the history of present illness.   Otherwise, review of systems are positive for dietary indiscretion.   All other systems are reviewed and negative.    PHYSICAL EXAM: VS:  BP 112/80   Pulse 74   Ht 5\' 10"  (1.778 m)   Wt 217 lb 1.9 oz (98.5 kg)   SpO2 97%   BMI 31.15 kg/m  ,  BMI Body mass index is 31.15 kg/m. GEN: Well nourished, well developed, in no acute distress  HEENT: normal  Neck: no JVD, carotid bruits, or masses Cardiac: irregularly irregular; no murmurs, rubs, or gallops,no edema  Respiratory:  clear to auscultation bilaterally, normal work of breathing GI: soft, nontender, nondistended, + BS MS: no deformity or atrophy  Skin: warm and dry, no rash Neuro:  Strength and sensation are intact Psych: euthymic mood, full affect   EKG:   The ekg ordered today demonstrates AFib, rate controlled   Recent Labs: No results found for requested labs within last 8760 hours.   Lipid Panel    Component Value Date/Time   CHOL 140 06/06/2017 1111   TRIG 113 06/06/2017 1111   HDL 35 (L) 06/06/2017 1111   CHOLHDL 4.0 06/06/2017 1111   CHOLHDL 4 03/10/2015 0829   VLDL 17.4 03/10/2015 0829   LDLCALC 82 06/06/2017 1111       Other studies Reviewed: Additional studies/ records that were reviewed today with results demonstrating: labs reviewed.   ASSESSMENT AND PLAN:  1. CAD: No angina.  Continue aggressive secondary prevention.  Increase exercise intensity as tolerated. 2. AFib/atrial flutter: Rate controlled.  Eliquis for stroke prevention. 3. Mitral regurgitation: No CHF. 4. Hyperlipidemia: LDL 80 in 12/19.  Continue simvastatin.   Current medicines are reviewed at length with the patient today.  The patient concerns regarding his medicines were addressed.  The following changes have been made:  No change  Labs/ tests ordered today include:  No orders of the defined types were placed in this encounter.   Recommend 150 minutes/week of aerobic exercise Low fat, low carb, high fiber diet recommended  Disposition:   FU in 1 year   Signed, Larae Grooms, MD  07/17/2018 1:34 PM    [facility] [place], [place], [place]  [address] Phone: [phone]; Fax: [phone]

## 2018-07-17 ENCOUNTER — Encounter (INDEPENDENT_AMBULATORY_CARE_PROVIDER_SITE_OTHER): Payer: Self-pay

## 2018-07-17 ENCOUNTER — Ambulatory Visit: Payer: PPO | Admitting: Interventional Cardiology

## 2018-07-17 ENCOUNTER — Encounter: Payer: Self-pay | Admitting: Interventional Cardiology

## 2018-07-17 VITALS — BP 112/80 | HR 74 | Ht 70.0 in | Wt 217.1 lb

## 2018-07-17 DIAGNOSIS — I340 Nonrheumatic mitral (valve) insufficiency: Secondary | ICD-10-CM | POA: Diagnosis not present

## 2018-07-17 DIAGNOSIS — I4891 Unspecified atrial fibrillation: Secondary | ICD-10-CM | POA: Diagnosis not present

## 2018-07-17 DIAGNOSIS — E785 Hyperlipidemia, unspecified: Secondary | ICD-10-CM

## 2018-07-17 DIAGNOSIS — I4892 Unspecified atrial flutter: Secondary | ICD-10-CM

## 2018-07-17 DIAGNOSIS — I2510 Atherosclerotic heart disease of native coronary artery without angina pectoris: Secondary | ICD-10-CM | POA: Diagnosis not present

## 2018-07-17 MED ORDER — METOPROLOL TARTRATE 25 MG PO TABS: 25.0000 mg | ORAL_TABLET | Freq: Every day | ORAL | 11 refills | Status: DC

## 2018-07-17 MED ORDER — APIXABAN 5 MG PO TABS: ORAL_TABLET | ORAL | 2 refills | Status: DC

## 2018-07-17 NOTE — Patient Instructions (Signed)

## 2018-07-19 ENCOUNTER — Other Ambulatory Visit: Payer: Self-pay | Admitting: *Deleted

## 2018-07-19 ENCOUNTER — Telehealth: Payer: Self-pay | Admitting: Interventional Cardiology

## 2018-07-19 MED ORDER — LISINOPRIL 5 MG PO TABS: 5.0000 mg | ORAL_TABLET | Freq: Every day | ORAL | 3 refills | Status: DC

## 2018-07-19 MED ORDER — SIMVASTATIN 40 MG PO TABS: ORAL_TABLET | ORAL | 3 refills | Status: DC

## 2018-07-19 MED ORDER — METOPROLOL TARTRATE 25 MG PO TABS: 25.0000 mg | ORAL_TABLET | Freq: Every day | ORAL | 3 refills | Status: DC

## 2018-07-19 MED ORDER — APIXABAN 5 MG PO TABS: ORAL_TABLET | ORAL | 3 refills | Status: DC

## 2018-07-19 NOTE — Telephone Encounter (Signed)
° ° °  Carol Pinnick at [facility] calling to request 90 days supply instead of 30, and adjust refill   Please contact Arbie Cookey directly at [phone]      1. Which medications need to be refilled? (please list name of each medication and dose if known) apixaban (ELIQUIS) 5 MG TABS tablet AND metoprolol tartrate (LOPRESSOR) 25 MG tablet  2. Which pharmacy/location (including street and city if local pharmacy) is medication to be sent to? [facility]   3. Do they need a 30 day or 90 day supply? [place]

## 2018-07-28 DIAGNOSIS — G4733 Obstructive sleep apnea (adult) (pediatric): Secondary | ICD-10-CM | POA: Diagnosis not present

## 2018-07-28 DIAGNOSIS — G4731 Primary central sleep apnea: Secondary | ICD-10-CM | POA: Diagnosis not present

## 2018-08-01 DIAGNOSIS — G4733 Obstructive sleep apnea (adult) (pediatric): Secondary | ICD-10-CM | POA: Diagnosis not present

## 2018-08-17 DIAGNOSIS — L570 Actinic keratosis: Secondary | ICD-10-CM | POA: Diagnosis not present

## 2018-08-28 DIAGNOSIS — G4733 Obstructive sleep apnea (adult) (pediatric): Secondary | ICD-10-CM | POA: Diagnosis not present

## 2018-08-28 DIAGNOSIS — G4731 Primary central sleep apnea: Secondary | ICD-10-CM | POA: Diagnosis not present

## 2018-09-26 DIAGNOSIS — G4733 Obstructive sleep apnea (adult) (pediatric): Secondary | ICD-10-CM | POA: Diagnosis not present

## 2018-09-26 DIAGNOSIS — G4731 Primary central sleep apnea: Secondary | ICD-10-CM | POA: Diagnosis not present

## 2018-10-18 DIAGNOSIS — G4731 Primary central sleep apnea: Secondary | ICD-10-CM | POA: Diagnosis not present

## 2018-10-18 DIAGNOSIS — G4733 Obstructive sleep apnea (adult) (pediatric): Secondary | ICD-10-CM | POA: Diagnosis not present

## 2018-10-27 DIAGNOSIS — G4733 Obstructive sleep apnea (adult) (pediatric): Secondary | ICD-10-CM | POA: Diagnosis not present

## 2018-10-27 DIAGNOSIS — G4731 Primary central sleep apnea: Secondary | ICD-10-CM | POA: Diagnosis not present

## 2018-11-26 DIAGNOSIS — G4733 Obstructive sleep apnea (adult) (pediatric): Secondary | ICD-10-CM | POA: Diagnosis not present

## 2018-11-26 DIAGNOSIS — G4731 Primary central sleep apnea: Secondary | ICD-10-CM | POA: Diagnosis not present

## 2018-12-11 DIAGNOSIS — R7303 Prediabetes: Secondary | ICD-10-CM | POA: Diagnosis not present

## 2018-12-20 DIAGNOSIS — G4733 Obstructive sleep apnea (adult) (pediatric): Secondary | ICD-10-CM | POA: Diagnosis not present

## 2018-12-20 DIAGNOSIS — G4731 Primary central sleep apnea: Secondary | ICD-10-CM | POA: Diagnosis not present

## 2018-12-27 DIAGNOSIS — G4733 Obstructive sleep apnea (adult) (pediatric): Secondary | ICD-10-CM | POA: Diagnosis not present

## 2018-12-27 DIAGNOSIS — G4731 Primary central sleep apnea: Secondary | ICD-10-CM | POA: Diagnosis not present

## 2019-01-21 DIAGNOSIS — R7309 Other abnormal glucose: Secondary | ICD-10-CM | POA: Diagnosis not present

## 2019-01-21 DIAGNOSIS — H40013 Open angle with borderline findings, low risk, bilateral: Secondary | ICD-10-CM | POA: Diagnosis not present

## 2019-01-21 DIAGNOSIS — H2513 Age-related nuclear cataract, bilateral: Secondary | ICD-10-CM | POA: Diagnosis not present

## 2019-01-21 DIAGNOSIS — H25013 Cortical age-related cataract, bilateral: Secondary | ICD-10-CM | POA: Diagnosis not present

## 2019-01-26 DIAGNOSIS — G4731 Primary central sleep apnea: Secondary | ICD-10-CM | POA: Diagnosis not present

## 2019-01-26 DIAGNOSIS — G4733 Obstructive sleep apnea (adult) (pediatric): Secondary | ICD-10-CM | POA: Diagnosis not present

## 2019-02-04 DIAGNOSIS — H2512 Age-related nuclear cataract, left eye: Secondary | ICD-10-CM | POA: Diagnosis not present

## 2019-02-04 DIAGNOSIS — H2513 Age-related nuclear cataract, bilateral: Secondary | ICD-10-CM | POA: Diagnosis not present

## 2019-02-04 DIAGNOSIS — H25013 Cortical age-related cataract, bilateral: Secondary | ICD-10-CM | POA: Diagnosis not present

## 2019-02-20 DIAGNOSIS — H25812 Combined forms of age-related cataract, left eye: Secondary | ICD-10-CM | POA: Diagnosis not present

## 2019-02-20 DIAGNOSIS — H2512 Age-related nuclear cataract, left eye: Secondary | ICD-10-CM | POA: Diagnosis not present

## 2019-02-26 DIAGNOSIS — G4731 Primary central sleep apnea: Secondary | ICD-10-CM | POA: Diagnosis not present

## 2019-02-26 DIAGNOSIS — H2511 Age-related nuclear cataract, right eye: Secondary | ICD-10-CM | POA: Diagnosis not present

## 2019-02-26 DIAGNOSIS — G4733 Obstructive sleep apnea (adult) (pediatric): Secondary | ICD-10-CM | POA: Diagnosis not present

## 2019-02-26 DIAGNOSIS — H25011 Cortical age-related cataract, right eye: Secondary | ICD-10-CM | POA: Diagnosis not present

## 2019-03-06 DIAGNOSIS — H25011 Cortical age-related cataract, right eye: Secondary | ICD-10-CM | POA: Diagnosis not present

## 2019-03-06 DIAGNOSIS — H2511 Age-related nuclear cataract, right eye: Secondary | ICD-10-CM | POA: Diagnosis not present

## 2019-03-06 DIAGNOSIS — H25811 Combined forms of age-related cataract, right eye: Secondary | ICD-10-CM | POA: Diagnosis not present

## 2019-03-18 DIAGNOSIS — G4733 Obstructive sleep apnea (adult) (pediatric): Secondary | ICD-10-CM | POA: Diagnosis not present

## 2019-03-18 DIAGNOSIS — G4731 Primary central sleep apnea: Secondary | ICD-10-CM | POA: Diagnosis not present

## 2019-03-29 DIAGNOSIS — G4733 Obstructive sleep apnea (adult) (pediatric): Secondary | ICD-10-CM | POA: Diagnosis not present

## 2019-03-29 DIAGNOSIS — G4731 Primary central sleep apnea: Secondary | ICD-10-CM | POA: Diagnosis not present

## 2019-04-16 DIAGNOSIS — L989 Disorder of the skin and subcutaneous tissue, unspecified: Secondary | ICD-10-CM | POA: Diagnosis not present

## 2019-04-23 DIAGNOSIS — D485 Neoplasm of uncertain behavior of skin: Secondary | ICD-10-CM | POA: Diagnosis not present

## 2019-04-23 DIAGNOSIS — C44622 Squamous cell carcinoma of skin of right upper limb, including shoulder: Secondary | ICD-10-CM | POA: Diagnosis not present

## 2019-04-23 DIAGNOSIS — L570 Actinic keratosis: Secondary | ICD-10-CM | POA: Diagnosis not present

## 2019-04-28 DIAGNOSIS — G4731 Primary central sleep apnea: Secondary | ICD-10-CM | POA: Diagnosis not present

## 2019-04-28 DIAGNOSIS — G4733 Obstructive sleep apnea (adult) (pediatric): Secondary | ICD-10-CM | POA: Diagnosis not present

## 2019-05-29 DIAGNOSIS — G4731 Primary central sleep apnea: Secondary | ICD-10-CM | POA: Diagnosis not present

## 2019-05-29 DIAGNOSIS — G4733 Obstructive sleep apnea (adult) (pediatric): Secondary | ICD-10-CM | POA: Diagnosis not present

## 2019-06-20 DIAGNOSIS — E7800 Pure hypercholesterolemia, unspecified: Secondary | ICD-10-CM | POA: Diagnosis not present

## 2019-06-20 DIAGNOSIS — I4891 Unspecified atrial fibrillation: Secondary | ICD-10-CM | POA: Diagnosis not present

## 2019-06-20 DIAGNOSIS — R7303 Prediabetes: Secondary | ICD-10-CM | POA: Diagnosis not present

## 2019-06-20 DIAGNOSIS — G4733 Obstructive sleep apnea (adult) (pediatric): Secondary | ICD-10-CM | POA: Diagnosis not present

## 2019-06-20 DIAGNOSIS — Z0000 Encounter for general adult medical examination without abnormal findings: Secondary | ICD-10-CM | POA: Diagnosis not present

## 2019-06-20 DIAGNOSIS — I10 Essential (primary) hypertension: Secondary | ICD-10-CM | POA: Diagnosis not present

## 2019-06-26 DIAGNOSIS — E7800 Pure hypercholesterolemia, unspecified: Secondary | ICD-10-CM | POA: Diagnosis not present

## 2019-06-26 DIAGNOSIS — I2510 Atherosclerotic heart disease of native coronary artery without angina pectoris: Secondary | ICD-10-CM | POA: Diagnosis not present

## 2019-06-26 DIAGNOSIS — I10 Essential (primary) hypertension: Secondary | ICD-10-CM | POA: Diagnosis not present

## 2019-06-26 DIAGNOSIS — I4891 Unspecified atrial fibrillation: Secondary | ICD-10-CM | POA: Diagnosis not present

## 2019-06-26 DIAGNOSIS — H35033 Hypertensive retinopathy, bilateral: Secondary | ICD-10-CM | POA: Diagnosis not present

## 2019-07-23 NOTE — Progress Notes (Signed)
Cardiology Office Note   Date:  07/25/2019   ID:  Jag, [place] 1946/04/24, MRN [id number]  PCP:  Shirline Frees, MD    No chief complaint on file.  CAD  Wt Readings from Last 3 Encounters:  07/25/19 208 lb 12.8 oz (94.7 kg)  07/17/18 217 lb 1.9 oz (98.5 kg)  06/06/17 213 lb 3.2 oz (96.7 kg)       History of Present Illness: Arthur Gardner is a 74 y.o. male  who has had a DES to the LAD in 3/10.He had paroxysmal atrial flutter in the past but this was medically managed.He was diagnosed with AFib. He had a cardioversion but reverted back to AFib quickly. He saw EP. He is going to be managed with rate control and anticoagulation.   Negative nuclear stress test in 8/17.  Since the last visit, he feels well.  He walks 3x/day, up to 1 mile at a time.  BPs at home were reviewed and well controlled. He does walk regularly.    Denies : Chest pain. Dizziness. Leg edema. Nitroglycerin use. Orthopnea. Palpitations. Paroxysmal nocturnal dyspnea. Shortness of breath. Syncope.   Walking limited by dog stopping, so he is trying to increase intensity.    Past Medical History:  Diagnosis Date  . Atrial flutter ([place])   . BPH (benign prostatic hyperplasia)   . CAD (coronary artery disease)    status post drug-eluting stent to the left anterior descending.  2/10  . Hearing loss    wears hearing aids  . HTN (hypertension)   . Hypercholesteremia   . Peptic ulcer    (remote).   . Persistent atrial fibrillation (HCC)    chads2vasc score is 3  . Sleep apnea     Past Surgical History:  Procedure Laterality Date  . APPENDECTOMY    . CARDIOVERSION N/A 01/14/2016   Procedure: CARDIOVERSION;  Surgeon: Fay Records, MD;  Location: [facility] ENDOSCOPY;  Service: Cardiovascular;  Laterality: N/A;  . HERNIA REPAIR  2000     Current Outpatient Medications  Medication Sig Dispense Refill  . apixaban (ELIQUIS) 5 MG TABS tablet TAKE 1 TABLET(5 MG) BY MOUTH TWICE DAILY 180 tablet 3    . Coenzyme Q10 (COQ-10 PO) Take 200 mg by mouth daily.     . Fish Oil OIL Take 2,000 mg by mouth 2 (two) times daily.     Marland Kitchen lisinopril (PRINIVIL,ZESTRIL) 5 MG tablet Take 1 tablet (5 mg total) by mouth daily. 90 tablet 3  . metoprolol tartrate (LOPRESSOR) 25 MG tablet Take 1 tablet (25 mg total) by mouth daily. 90 tablet 3  . simvastatin (ZOCOR) 40 MG tablet TAKE 1 TABLET(40 MG) BY MOUTH DAILY 90 tablet 3   No current facility-administered medications for this visit.    Allergies:   Doxycycline hyclate and Other    Social History:  The patient  reports that he has quit smoking. He has never used smokeless tobacco. He reports current alcohol use. He reports that he does not use drugs.   Family History:  The patient's family history includes Heart disease in his father; Hypertension in his mother.    ROS:  Please see the history of present illness.   Otherwise, review of systems are positive for intentional weight loss- using vinegar before meals.   All other systems are reviewed and negative.    PHYSICAL EXAM: VS:  BP 132/78   Pulse 67   Ht 5\' 10"  (1.778 m)   Wt 208  lb 12.8 oz (94.7 kg)   SpO2 96%   BMI 29.96 kg/m  , BMI Body mass index is 29.96 kg/m. GEN: Well nourished, well developed, in no acute distress  HEENT: normal  Neck: no JVD, carotid bruits, or masses Cardiac: irregularly irregular; no murmurs, rubs, or gallops,no edema  Respiratory:  clear to auscultation bilaterally, normal work of breathing GI: soft, nontender, nondistended, + BS MS: no deformity or atrophy  Skin: warm and dry, no rash Neuro:  Strength and sensation are intact Psych: euthymic mood, full affect   EKG:   The ekg ordered today demonstrates AFib, rate controlled   Recent Labs: No results found for requested labs within last 8760 hours.   Lipid Panel    Component Value Date/Time   CHOL 140 06/06/2017 1111   TRIG 113 06/06/2017 1111   HDL 35 (L) 06/06/2017 1111   CHOLHDL 4.0 06/06/2017  1111   CHOLHDL 4 03/10/2015 0829   VLDL 17.4 03/10/2015 0829   LDLCALC 82 06/06/2017 1111     Other studies Reviewed: Additional studies/ records that were reviewed today with results demonstrating: labs reviewed.   ASSESSMENT AND PLAN:  1. CAD:  No angina.  Continue aggressive secondary prevention.  2. AFib/atrial flutter: Rate controlled.  Taking metoprolol in the morning.  No high HR reported; some borderline low HR at times. Will continue the once a day metoprolol.  Likely does not need it at night when going to sleep. 3. Mitral regurgitation: No evidence of CHF. 4. Hyperlipidemia: Controlled in 06/2019. 5. Anticoagulated: No bleeding issues. Hbg 15.8 in 06/2019.   6. Continue with weight loss efforts as he has lost inches on his weight as well.   Current medicines are reviewed at length with the patient today.  The patient concerns regarding his medicines were addressed.  The following changes have been made:  No change  Labs/ tests ordered today include:  No orders of the defined types were placed in this encounter.   Recommend 150 minutes/week of aerobic exercise Low fat, low carb, high fiber diet recommended  Disposition:   FU in 1 year   Signed, Larae Grooms, MD  07/25/2019 8:55 AM    [facility] [place], [place], [place]  [address] Phone: [phone]; Fax: [phone]

## 2019-07-25 ENCOUNTER — Other Ambulatory Visit: Payer: Self-pay

## 2019-07-25 ENCOUNTER — Encounter: Payer: Self-pay | Admitting: Interventional Cardiology

## 2019-07-25 ENCOUNTER — Ambulatory Visit: Payer: PPO | Admitting: Interventional Cardiology

## 2019-07-25 VITALS — BP 132/78 | HR 67 | Ht 70.0 in | Wt 208.8 lb

## 2019-07-25 DIAGNOSIS — E785 Hyperlipidemia, unspecified: Secondary | ICD-10-CM

## 2019-07-25 DIAGNOSIS — I4891 Unspecified atrial fibrillation: Secondary | ICD-10-CM | POA: Diagnosis not present

## 2019-07-25 DIAGNOSIS — Z7901 Long term (current) use of anticoagulants: Secondary | ICD-10-CM

## 2019-07-25 DIAGNOSIS — I2510 Atherosclerotic heart disease of native coronary artery without angina pectoris: Secondary | ICD-10-CM

## 2019-07-25 MED ORDER — METOPROLOL TARTRATE 25 MG PO TABS: 25.0000 mg | ORAL_TABLET | Freq: Every day | ORAL | 3 refills | Status: DC

## 2019-07-25 MED ORDER — SIMVASTATIN 40 MG PO TABS: ORAL_TABLET | ORAL | 3 refills | Status: DC

## 2019-07-25 MED ORDER — APIXABAN 5 MG PO TABS: ORAL_TABLET | ORAL | 1 refills | Status: DC

## 2019-07-25 NOTE — Patient Instructions (Addendum)
Medication Instructions:  Your physician recommends that you continue on your current medications as directed. Please refer to the Current Medication list given to you today.  *If you need a refill on your cardiac medications before your next appointment, please call your pharmacy*  Lab Work: None Ordered  If you have labs (blood work) drawn today and your tests are completely normal, you will receive your results only by: Marland Kitchen MyChart Message (if you have MyChart) OR . A paper copy in the mail If you have any lab test that is abnormal or we need to change your treatment, we will call you to review the results.  Testing/Procedures: None ordered  Follow-Up: At [facility], you and your health needs are our priority.  As part of our continuing mission to provide you with exceptional heart care, we have created designated Provider Care Teams.  These Care Teams include your primary Cardiologist (physician) and Advanced Practice Providers (APPs -  Physician Assistants and Nurse Practitioners) who all work together to provide you with the care you need, when you need it.  Your next appointment:   12 month(s)  The format for your next appointment:   In Person  Provider:   You may see Larae Grooms, MD or one of the following Advanced Practice Providers on your designated Care Team:    Melina Copa, PA-C  Ermalinda Barrios, PA-C   Other Instructions  We are recommending the COVID-19 vaccine to all of our patients. Cardiac medications (including blood thinners) should not deter anyone from being vaccinated and there is no need to hold any of those medications prior to vaccine administration.   Currently, there is a hotline to call (active 07/12/19) to schedule vaccination appointments as no walk-ins will be accepted.    Vaccines through the health department can be arranged by calling [phone]    Vaccines through [facility] can be arranged by calling [phone] or visiting  [email or web address]   If you have further questions or concerns about the vaccine process, please visit [email or web address], [email or web address], or contact your primary care physician.

## 2019-07-29 DIAGNOSIS — G4733 Obstructive sleep apnea (adult) (pediatric): Secondary | ICD-10-CM | POA: Diagnosis not present

## 2019-08-04 DIAGNOSIS — I2510 Atherosclerotic heart disease of native coronary artery without angina pectoris: Secondary | ICD-10-CM | POA: Diagnosis not present

## 2019-08-04 DIAGNOSIS — E7800 Pure hypercholesterolemia, unspecified: Secondary | ICD-10-CM | POA: Diagnosis not present

## 2019-08-04 DIAGNOSIS — I4891 Unspecified atrial fibrillation: Secondary | ICD-10-CM | POA: Diagnosis not present

## 2019-08-04 DIAGNOSIS — H35033 Hypertensive retinopathy, bilateral: Secondary | ICD-10-CM | POA: Diagnosis not present

## 2019-08-04 DIAGNOSIS — I10 Essential (primary) hypertension: Secondary | ICD-10-CM | POA: Diagnosis not present

## 2019-08-08 ENCOUNTER — Other Ambulatory Visit: Payer: Self-pay | Admitting: Interventional Cardiology

## 2019-08-08 MED ORDER — LISINOPRIL 5 MG PO TABS: 5.0000 mg | ORAL_TABLET | Freq: Every day | ORAL | 3 refills | Status: DC

## 2019-08-08 NOTE — Telephone Encounter (Signed)
Pt's medication was sent to pt's pharmacy as requested. Confirmation received.  °

## 2019-08-12 DIAGNOSIS — H35033 Hypertensive retinopathy, bilateral: Secondary | ICD-10-CM | POA: Diagnosis not present

## 2019-08-12 DIAGNOSIS — I2510 Atherosclerotic heart disease of native coronary artery without angina pectoris: Secondary | ICD-10-CM | POA: Diagnosis not present

## 2019-08-12 DIAGNOSIS — I10 Essential (primary) hypertension: Secondary | ICD-10-CM | POA: Diagnosis not present

## 2019-08-12 DIAGNOSIS — I4891 Unspecified atrial fibrillation: Secondary | ICD-10-CM | POA: Diagnosis not present

## 2019-08-12 DIAGNOSIS — E7800 Pure hypercholesterolemia, unspecified: Secondary | ICD-10-CM | POA: Diagnosis not present

## 2019-08-18 ENCOUNTER — Ambulatory Visit: Payer: PPO | Attending: Internal Medicine

## 2019-08-18 DIAGNOSIS — Z23 Encounter for immunization: Secondary | ICD-10-CM

## 2019-08-18 NOTE — Progress Notes (Signed)
   Covid-19 Vaccination Clinic  Name:  TAM PIANA    MRN: [id number] DOB: 03/20/1946  08/18/2019  Mr. Zabel was observed post Covid-19 immunization for 15 minutes without incidence. He was provided with Vaccine Information Sheet and instruction to access the V-Safe system.   Mr. Morden was instructed to call 911 with any severe reactions post vaccine: Marland Kitchen Difficulty breathing  . Swelling of your face and throat  . A fast heartbeat  . A bad rash all over your body  . Dizziness and weakness    Immunizations Administered    Name Date Dose VIS Date Route   Pfizer COVID-19 Vaccine 08/18/2019 11:46 AM 0.3 mL 06/14/2019 Intramuscular   Manufacturer: [place]   Lot: [id number]   [place]: [id number]

## 2019-09-05 DIAGNOSIS — E7800 Pure hypercholesterolemia, unspecified: Secondary | ICD-10-CM | POA: Diagnosis not present

## 2019-09-05 DIAGNOSIS — I2510 Atherosclerotic heart disease of native coronary artery without angina pectoris: Secondary | ICD-10-CM | POA: Diagnosis not present

## 2019-09-05 DIAGNOSIS — H35033 Hypertensive retinopathy, bilateral: Secondary | ICD-10-CM | POA: Diagnosis not present

## 2019-09-05 DIAGNOSIS — I10 Essential (primary) hypertension: Secondary | ICD-10-CM | POA: Diagnosis not present

## 2019-09-05 DIAGNOSIS — I4891 Unspecified atrial fibrillation: Secondary | ICD-10-CM | POA: Diagnosis not present

## 2019-09-10 ENCOUNTER — Ambulatory Visit: Payer: PPO | Attending: Internal Medicine

## 2019-09-10 DIAGNOSIS — Z23 Encounter for immunization: Secondary | ICD-10-CM

## 2019-09-10 NOTE — Progress Notes (Signed)
   Covid-19 Vaccination Clinic  Name:  Arthur Gardner    MRN: [id number] DOB: [birth year]  09/10/2019  [name] was observed post Covid-19 immunization for 15 minutes without incident. He was provided with Vaccine Information Sheet and instruction to access the V-Safe system.   [name] was instructed to call 911 with any severe reactions post vaccine: Marland Kitchen Difficulty breathing  . Swelling of face and throat  . A fast heartbeat  . A bad rash all over body  . Dizziness and weakness   Immunizations Administered    Name Date Dose VIS Date Route   Pfizer COVID-19 Vaccine 09/10/2019  3:10 PM 0.3 mL 06/14/2019 Intramuscular   Manufacturer: [place]   Lot: [id number]   [place]: [id number]

## 2019-10-01 DIAGNOSIS — I10 Essential (primary) hypertension: Secondary | ICD-10-CM | POA: Diagnosis not present

## 2019-10-03 DIAGNOSIS — H40013 Open angle with borderline findings, low risk, bilateral: Secondary | ICD-10-CM | POA: Diagnosis not present

## 2019-10-03 DIAGNOSIS — R7309 Other abnormal glucose: Secondary | ICD-10-CM | POA: Diagnosis not present

## 2019-10-03 DIAGNOSIS — Z961 Presence of intraocular lens: Secondary | ICD-10-CM | POA: Diagnosis not present

## 2019-10-03 DIAGNOSIS — H11003 Unspecified pterygium of eye, bilateral: Secondary | ICD-10-CM | POA: Diagnosis not present

## 2019-10-22 ENCOUNTER — Encounter: Payer: Self-pay | Admitting: *Deleted

## 2019-10-22 ENCOUNTER — Ambulatory Visit: Payer: PPO | Admitting: Physician Assistant

## 2019-10-22 ENCOUNTER — Encounter: Payer: Self-pay | Admitting: Physician Assistant

## 2019-10-22 ENCOUNTER — Other Ambulatory Visit: Payer: Self-pay

## 2019-10-22 DIAGNOSIS — L570 Actinic keratosis: Secondary | ICD-10-CM

## 2019-10-22 DIAGNOSIS — Z1283 Encounter for screening for malignant neoplasm of skin: Secondary | ICD-10-CM | POA: Diagnosis not present

## 2019-10-23 NOTE — Progress Notes (Signed)
   Follow-Up Visit   Subjective  Arthur Gardner is a 74 y.o. male who presents for the following: Follow-up (6 month - [place] cream [id number] applications - had a good reaction on arms- done with cream since feb).  His reaction was severe and could only tolerate 21 days. Had redness, burning and crusting.    The following portions of the chart were reviewed this encounter and updated as appropriate: Tobacco  Allergies  Meds  Problems  Med Hx  Surg Hx  Fam Hx      Objective  Well appearing patient in no apparent distress; mood and affect are within normal limits.  All skin waist up examined.  Objective  Mid Parietal Scalp: Erythematous patches with gritty scale.  Objective  Left Forearm - Anterior, Right Forearm - Anterior: Marked improvement of both arms s/p 5FU topical treatment  Assessment & Plan  AK (actinic keratosis) Mid Parietal Scalp  Destruction of lesion - Mid Parietal Scalp Complexity: simple   Destruction method: cryotherapy   Informed consent: discussed and consent obtained   Timeout:  patient name, date of birth, surgical site, and procedure verified Lesion destroyed using liquid nitrogen: Yes   Cryotherapy cycles:  1 Outcome: patient tolerated procedure well with no complications   Post-procedure details: wound care instructions given    Screening exam for skin cancer (2) Left Forearm - Anterior; Right Forearm - Anterior  3-6 month skin checks

## 2019-11-06 DIAGNOSIS — L03019 Cellulitis of unspecified finger: Secondary | ICD-10-CM | POA: Diagnosis not present

## 2019-11-28 ENCOUNTER — Other Ambulatory Visit: Payer: Self-pay

## 2019-11-28 ENCOUNTER — Ambulatory Visit (INDEPENDENT_AMBULATORY_CARE_PROVIDER_SITE_OTHER): Payer: PPO | Admitting: Physician Assistant

## 2019-11-28 ENCOUNTER — Encounter: Payer: Self-pay | Admitting: Physician Assistant

## 2019-11-28 DIAGNOSIS — L720 Epidermal cyst: Secondary | ICD-10-CM | POA: Diagnosis not present

## 2019-11-28 DIAGNOSIS — D489 Neoplasm of uncertain behavior, unspecified: Secondary | ICD-10-CM

## 2019-11-28 DIAGNOSIS — D485 Neoplasm of uncertain behavior of skin: Secondary | ICD-10-CM | POA: Diagnosis not present

## 2019-11-28 NOTE — Patient Instructions (Signed)

## 2019-11-28 NOTE — Progress Notes (Addendum)
   Follow-Up Visit   Subjective  Arthur Gardner is a 74 y.o. male who presents for the following: Procedure (here for cyst removal - left post shoulder).   The following portions of the chart were reviewed this encounter and updated as appropriate: Tobacco  Allergies  Meds  Problems  Med Hx  Surg Hx  Fam Hx      Objective  Well appearing patient in no apparent distress; mood and affect are within normal limits.  All skin waist up examined.  Objective  Left Upper Back: Large nodule 3-0 Vicryl x 4 4-0 Ethilon x 7 Size 4.7 cm     Assessment & Plan  Neoplasm of uncertain behavior Left Upper Back  Skin excision  Lesion length (cm):  4.7 Lesion width (cm):  0.1 Margin per side (cm):  0.1 Total excision diameter (cm):  4.9 Informed consent: discussed and consent obtained   Timeout: patient name, date of birth, surgical site, and procedure verified   Anesthesia: the lesion was anesthetized in a standard fashion   Anesthetic:  1% lidocaine w/ epinephrine 1-100,000 local infiltration Instrument used: #15 blade   Hemostasis achieved with: pressure and electrodesiccation   Outcome: patient tolerated procedure well with no complications   Post-procedure details: sterile dressing applied and wound care instructions given   Dressing type: bandage, petrolatum and pressure dressing    Skin repair Complexity:  Intermediate Final length (cm):  4.9 Informed consent: discussed and consent obtained   Timeout: patient name, date of birth, surgical site, and procedure verified   Procedure prep:  Patient was prepped and draped in usual sterile fashion Prep type:  Chlorhexidine Anesthesia: the lesion was anesthetized in a standard fashion   Reason for type of repair: reduce tension to allow closure, reduce the risk of dehiscence, infection, and necrosis, reduce subcutaneous dead space and avoid a hematoma, allow closure of the large defect, preserve normal anatomy, preserve normal  anatomical and functional relationships and enhance both functionality and cosmetic results   Undermining: edges undermined   Subcutaneous layers (deep stitches):  Suture size:  3-0 Suture type: Vicryl (polyglactin 910)   Stitches:  Buried vertical mattress Fine/surface layer approximation (top stitches):  Suture size:  4-0 Suture type: nylon   Suture type comment:  Nylon Stitches: simple interrupted   Hemostasis achieved with: suture Outcome: patient tolerated procedure well with no complications   Post-procedure details: wound care instructions given    Specimen 1 - Surgical pathology Differential Diagnosis:epidermal cyst Check Margins: No Large nodule Excision Cautery  I, Dewayne Severe, PA-C, have reviewed all documentation for this visit. The documentation on 03/06/20 for the exam, diagnosis, procedures, and orders are all accurate and complete.

## 2019-11-29 DIAGNOSIS — I10 Essential (primary) hypertension: Secondary | ICD-10-CM | POA: Diagnosis not present

## 2019-12-12 ENCOUNTER — Other Ambulatory Visit: Payer: Self-pay

## 2019-12-12 ENCOUNTER — Ambulatory Visit (INDEPENDENT_AMBULATORY_CARE_PROVIDER_SITE_OTHER): Payer: PPO | Admitting: *Deleted

## 2019-12-12 DIAGNOSIS — L729 Follicular cyst of the skin and subcutaneous tissue, unspecified: Secondary | ICD-10-CM

## 2019-12-12 DIAGNOSIS — Z4802 Encounter for removal of sutures: Secondary | ICD-10-CM

## 2019-12-12 NOTE — Progress Notes (Signed)
Here for suture removal on left upper back. Pathology results to patient. Healing well. Informed patient to remain from heavy lifting for 2 weeks after suture removal because of location

## 2019-12-18 ENCOUNTER — Other Ambulatory Visit: Payer: Self-pay

## 2019-12-18 MED ORDER — APIXABAN 5 MG PO TABS: ORAL_TABLET | ORAL | 1 refills | Status: DC

## 2019-12-18 NOTE — Telephone Encounter (Signed)
Pt last saw Dr Irish Lack 07/25/19, last labs 06/20/19 Creat 1.1 at [facility] per [place], age 74, weight 94.7kg, based on specified criteria pt is on appropriate dosage of Eliquis 5mg  BID.  Will refill rx.

## 2019-12-23 ENCOUNTER — Ambulatory Visit (INDEPENDENT_AMBULATORY_CARE_PROVIDER_SITE_OTHER): Payer: PPO | Admitting: *Deleted

## 2019-12-23 ENCOUNTER — Other Ambulatory Visit: Payer: Self-pay

## 2019-12-23 DIAGNOSIS — L729 Follicular cyst of the skin and subcutaneous tissue, unspecified: Secondary | ICD-10-CM

## 2019-12-23 DIAGNOSIS — Z4802 Encounter for removal of sutures: Secondary | ICD-10-CM

## 2019-12-23 NOTE — Progress Notes (Signed)
Patient here for a suture removal. Patient had a stitch that was left in. Removed x 1 suture. Spot looks good no signs of infection. Path to patient.

## 2020-02-12 DIAGNOSIS — H35033 Hypertensive retinopathy, bilateral: Secondary | ICD-10-CM | POA: Diagnosis not present

## 2020-02-12 DIAGNOSIS — I10 Essential (primary) hypertension: Secondary | ICD-10-CM | POA: Diagnosis not present

## 2020-02-12 DIAGNOSIS — E7800 Pure hypercholesterolemia, unspecified: Secondary | ICD-10-CM | POA: Diagnosis not present

## 2020-02-12 DIAGNOSIS — I4891 Unspecified atrial fibrillation: Secondary | ICD-10-CM | POA: Diagnosis not present

## 2020-02-12 DIAGNOSIS — I2510 Atherosclerotic heart disease of native coronary artery without angina pectoris: Secondary | ICD-10-CM | POA: Diagnosis not present

## 2020-02-20 DIAGNOSIS — G4733 Obstructive sleep apnea (adult) (pediatric): Secondary | ICD-10-CM | POA: Diagnosis not present

## 2020-02-20 DIAGNOSIS — R0982 Postnasal drip: Secondary | ICD-10-CM | POA: Diagnosis not present

## 2020-02-20 DIAGNOSIS — J343 Hypertrophy of nasal turbinates: Secondary | ICD-10-CM | POA: Diagnosis not present

## 2020-03-03 DIAGNOSIS — I10 Essential (primary) hypertension: Secondary | ICD-10-CM | POA: Diagnosis not present

## 2020-03-17 DIAGNOSIS — E7800 Pure hypercholesterolemia, unspecified: Secondary | ICD-10-CM | POA: Diagnosis not present

## 2020-03-17 DIAGNOSIS — H35033 Hypertensive retinopathy, bilateral: Secondary | ICD-10-CM | POA: Diagnosis not present

## 2020-03-17 DIAGNOSIS — I2510 Atherosclerotic heart disease of native coronary artery without angina pectoris: Secondary | ICD-10-CM | POA: Diagnosis not present

## 2020-03-17 DIAGNOSIS — I4891 Unspecified atrial fibrillation: Secondary | ICD-10-CM | POA: Diagnosis not present

## 2020-03-17 DIAGNOSIS — I10 Essential (primary) hypertension: Secondary | ICD-10-CM | POA: Diagnosis not present

## 2020-03-20 DIAGNOSIS — H43392 Other vitreous opacities, left eye: Secondary | ICD-10-CM | POA: Diagnosis not present

## 2020-03-20 DIAGNOSIS — H43813 Vitreous degeneration, bilateral: Secondary | ICD-10-CM | POA: Diagnosis not present

## 2020-04-17 DIAGNOSIS — H43813 Vitreous degeneration, bilateral: Secondary | ICD-10-CM | POA: Diagnosis not present

## 2020-04-17 DIAGNOSIS — H43392 Other vitreous opacities, left eye: Secondary | ICD-10-CM | POA: Diagnosis not present

## 2020-05-13 DIAGNOSIS — H903 Sensorineural hearing loss, bilateral: Secondary | ICD-10-CM | POA: Diagnosis not present

## 2020-06-22 DIAGNOSIS — I4891 Unspecified atrial fibrillation: Secondary | ICD-10-CM | POA: Diagnosis not present

## 2020-06-22 DIAGNOSIS — H35033 Hypertensive retinopathy, bilateral: Secondary | ICD-10-CM | POA: Diagnosis not present

## 2020-06-22 DIAGNOSIS — I2510 Atherosclerotic heart disease of native coronary artery without angina pectoris: Secondary | ICD-10-CM | POA: Diagnosis not present

## 2020-06-22 DIAGNOSIS — I10 Essential (primary) hypertension: Secondary | ICD-10-CM | POA: Diagnosis not present

## 2020-06-22 DIAGNOSIS — E7800 Pure hypercholesterolemia, unspecified: Secondary | ICD-10-CM | POA: Diagnosis not present

## 2020-06-25 ENCOUNTER — Other Ambulatory Visit: Payer: Self-pay

## 2020-06-25 MED ORDER — LISINOPRIL 5 MG PO TABS: 5.0000 mg | ORAL_TABLET | Freq: Every day | ORAL | 0 refills | Status: DC

## 2020-06-25 MED ORDER — SIMVASTATIN 40 MG PO TABS: ORAL_TABLET | ORAL | 0 refills | Status: DC

## 2020-06-25 NOTE — Telephone Encounter (Signed)
Pt's medication was sent to pt's pharmacy as requested. Confirmation received.  °

## 2020-06-29 ENCOUNTER — Other Ambulatory Visit: Payer: Self-pay

## 2020-06-29 MED ORDER — METOPROLOL TARTRATE 25 MG PO TABS: 25.0000 mg | ORAL_TABLET | Freq: Every day | ORAL | 0 refills | Status: DC

## 2020-07-09 ENCOUNTER — Telehealth: Payer: Self-pay | Admitting: Interventional Cardiology

## 2020-07-09 MED ORDER — APIXABAN 5 MG PO TABS: ORAL_TABLET | ORAL | 0 refills | Status: DC

## 2020-07-09 NOTE — Telephone Encounter (Signed)
Prescription refill request for Eliquis received.  Indication: Aflutter, Afib Last office visit: Varanasi, 07/25/2019 Scr: 1.10, 06/20/2019 Age: 75 yo Weight: 94.7 kg   Pt is overdue for labs. Put on appointment note when she sees [place] 07/27/20 for pt to get CBC and Bmet at the next office visit.

## 2020-07-09 NOTE — Telephone Encounter (Signed)
*  STAT* If patient is at the pharmacy, call can be transferred to refill team.   1. Which medications need to be refilled? (please list name of each medication and dose if known)  apixaban (ELIQUIS) 5 MG TABS tablet  2. Which pharmacy/location (including street and city if local pharmacy) is medication to be sent to? [facility] - [place], [place] - [address]  3. Do they need a 30 day or 90 day supply? 90 with refills  [facility] just sent their last refill. The company needs a new RX

## 2020-07-13 DIAGNOSIS — R7303 Prediabetes: Secondary | ICD-10-CM | POA: Diagnosis not present

## 2020-07-13 DIAGNOSIS — E7800 Pure hypercholesterolemia, unspecified: Secondary | ICD-10-CM | POA: Diagnosis not present

## 2020-07-13 DIAGNOSIS — Z1211 Encounter for screening for malignant neoplasm of colon: Secondary | ICD-10-CM | POA: Diagnosis not present

## 2020-07-13 DIAGNOSIS — M25551 Pain in right hip: Secondary | ICD-10-CM | POA: Diagnosis not present

## 2020-07-13 DIAGNOSIS — G4733 Obstructive sleep apnea (adult) (pediatric): Secondary | ICD-10-CM | POA: Diagnosis not present

## 2020-07-13 DIAGNOSIS — M25552 Pain in left hip: Secondary | ICD-10-CM | POA: Diagnosis not present

## 2020-07-13 DIAGNOSIS — Z0000 Encounter for general adult medical examination without abnormal findings: Secondary | ICD-10-CM | POA: Diagnosis not present

## 2020-07-13 DIAGNOSIS — I4891 Unspecified atrial fibrillation: Secondary | ICD-10-CM | POA: Diagnosis not present

## 2020-07-13 DIAGNOSIS — I10 Essential (primary) hypertension: Secondary | ICD-10-CM | POA: Diagnosis not present

## 2020-07-14 ENCOUNTER — Ambulatory Visit
Admission: RE | Admit: 2020-07-14 | Discharge: 2020-07-14 | Disposition: A | Discharge status: Home / Self Care | Payer: PPO | Source: Ambulatory Visit | Attending: Family Medicine | Admitting: Family Medicine

## 2020-07-14 ENCOUNTER — Other Ambulatory Visit: Payer: Self-pay | Admitting: Family Medicine

## 2020-07-14 DIAGNOSIS — M25551 Pain in right hip: Secondary | ICD-10-CM

## 2020-07-14 DIAGNOSIS — M160 Bilateral primary osteoarthritis of hip: Secondary | ICD-10-CM | POA: Diagnosis not present

## 2020-07-22 DIAGNOSIS — G4733 Obstructive sleep apnea (adult) (pediatric): Secondary | ICD-10-CM | POA: Diagnosis not present

## 2020-07-26 NOTE — Progress Notes (Signed)
Cardiology Office Note   Date:  07/27/2020   ID:  [name], [name] 1945/10/23, MRN [id number]  PCP:  Shirline Frees, MD    No chief complaint on file.  CAD  Wt Readings from Last 3 Encounters:  07/27/20 216 lb 3.2 oz (98.1 kg)  07/25/19 208 lb 12.8 oz (94.7 kg)  07/17/18 217 lb 1.9 oz (98.5 kg)       History of Present Illness: Arthur Gardner is a 75 y.o. male   who has had a DES to the LAD in 3/10.He had paroxysmal atrial flutter in the past but this was medically managed.He was diagnosed with AFib. He had a cardioversion but reverted back to AFib quickly. He saw EP. He is going to be managed with rate control and anticoagulation.   Negative nuclear stress test in 8/17.  Since the last visit, he has felt well.  Denies : Chest pain.  Leg edema. Nitroglycerin use. Orthopnea.  Paroxysmal nocturnal dyspnea. Shortness of breath. Syncope.   Had one episode of dizziness when his HR had increased while walking.  Check BP and it was normal.  Was not close to passing out.  No other episodes like this.   He does not drink a lot of water on a regular basis.   Walks a mile 3x/day.     Past Medical History:  Diagnosis Date  . Atrial flutter ([place])   . Basal cell carcinoma 02/11/2015   sup +nod- Right zygoma-(CX35FU)  . BPH (benign prostatic hyperplasia)   . CAD (coronary artery disease)    status post drug-eluting stent to the left anterior descending.  2/10  . Hearing loss    wears hearing aids  . HTN (hypertension)   . Hypercholesteremia   . Peptic ulcer    (remote).   . Persistent atrial fibrillation (HCC)    chads2vasc score is 3  . Sleep apnea   . Squamous cell carcinoma of skin 08/14/2012   right jawline (cx54fu)  . Squamous cell carcinoma of skin 10/29/2003    bowens-left wrist, sup-(CX35FU), in situ- left wrist,inf (CX35FU), in situ-mid bridge nose (Cx35FU)  . Squamous cell carcinoma of skin 08/02/2013   in situ-left preauricular (txpbx)  . Squamous  cell carcinoma of skin 10/29/2013   mod diff-right neck (txpbx)  . Squamous cell carcinoma of skin 10/22/2014   well diff- Riht jawline ([facility])  . Squamous cell carcinoma of skin 03/27/2018   well diff- right front scalp (watch), in situ-right neck,sup (CX35FU), well diff-right neck, inf (CX35FU)  . Squamous cell carcinoma of skin 04/23/2019   KA-right hand (txpbx)    Past Surgical History:  Procedure Laterality Date  . APPENDECTOMY    . CARDIOVERSION N/A 01/14/2016   Procedure: CARDIOVERSION;  Surgeon: Fay Records, MD;  Location: [facility] ENDOSCOPY;  Service: Cardiovascular;  Laterality: N/A;  . HERNIA REPAIR  2000     Current Outpatient Medications  Medication Sig Dispense Refill  . apixaban (ELIQUIS) 5 MG TABS tablet TAKE 1 TABLET(5 MG) BY MOUTH TWICE DAILY 180 tablet 0  . Coenzyme Q10 (COQ-10 PO) Take 200 mg by mouth daily.     . Fish Oil OIL Take 2,000 mg by mouth 2 (two) times daily.     Marland Kitchen lisinopril (ZESTRIL) 5 MG tablet Take 1 tablet (5 mg total) by mouth daily. 30 tablet 0  . metoprolol tartrate (LOPRESSOR) 25 MG tablet Take 1 tablet (25 mg total) by mouth daily. 90 tablet 0  . simvastatin (ZOCOR)  40 MG tablet TAKE 1 TABLET(40 MG) BY MOUTH DAILY 30 tablet 0   No current facility-administered medications for this visit.    Allergies:   Doxycycline hyclate and Other    Social History:  The patient  reports that he has quit smoking. He has never used smokeless tobacco. He reports current alcohol use. He reports that he does not use drugs.   Family History:  The patient's family history includes Heart disease in his father; Hypertension in his mother.    ROS:  Please see the history of present illness.   Otherwise, review of systems are positive for rare palpitations.   All other systems are reviewed and negative.    PHYSICAL EXAM: VS:  BP 112/74   Pulse (!) 58   Ht 5\' 10"  (1.778 m)   Wt 216 lb 3.2 oz (98.1 kg)   SpO2 97%   BMI 31.02 kg/m  , BMI Body mass index is 31.02  kg/m. GEN: Well nourished, well developed, in no acute distress  HEENT: normal  Neck: no JVD, carotid bruits, or masses Cardiac: irregularly irregular; no murmurs, rubs, or gallops,no edema  Respiratory:  clear to auscultation bilaterally, normal work of breathing GI: soft, nontender, nondistended, + BS, mild obesity MS: no deformity or atrophy  Skin: warm and dry, no rash Neuro:  Strength and sensation are intact Psych: euthymic mood, full affect   EKG:   The ekg ordered today demonstrates AFib, rate controlled   Recent Labs: No results found for requested labs within last 8760 hours.   Lipid Panel    Component Value Date/Time   CHOL 140 06/06/2017 1111   TRIG 113 06/06/2017 1111   HDL 35 (L) 06/06/2017 1111   CHOLHDL 4.0 06/06/2017 1111   CHOLHDL 4 03/10/2015 0829   VLDL 17.4 03/10/2015 0829   LDLCALC 82 06/06/2017 1111     Other studies Reviewed: Additional studies/ records that were reviewed today with results demonstrating: labs from 2022 reviewed.   ASSESSMENT AND PLAN:  1. CAD: No angina. Continue aggressive secondary prevention.  2. AFib: Rate controlled. No bleeding issues. Continue metoprolol.  I suspect he may have been a little dehydrated at the time.  3. Mitral regurgitation: No CHF. 4. Hyperlipidemia: The current medical regimen is effective;  continue present plan and medications.   5. Anticoagulation: No bleeding problems.   6. PreDM: 5.9 A1C in Jan 2022. Whole food, plant based diet.  Avoid processed foods. 7. He got COVID vaccines.  He has not had the booster no does he want it.  He would be interested in an Ivermectin treatment. He has not tested positive for COVID at any point.   Current medicines are reviewed at length with the patient today.  The patient concerns regarding his medicines were addressed.  The following changes have been made:  No change  Labs/ tests ordered today include:  No orders of the defined types were placed in this  encounter.   Recommend 150 minutes/week of aerobic exercise Low fat, low carb, high fiber diet recommended  Disposition:   FU in 1 year   Signed, Larae Grooms, MD  07/27/2020 11:15 AM    [address] [place], [place],   [address] Phone: [phone]; Fax: [phone]

## 2020-07-27 ENCOUNTER — Encounter: Payer: Self-pay | Admitting: Interventional Cardiology

## 2020-07-27 ENCOUNTER — Other Ambulatory Visit: Payer: Self-pay

## 2020-07-27 ENCOUNTER — Ambulatory Visit: Payer: PPO | Admitting: Interventional Cardiology

## 2020-07-27 VITALS — BP 112/74 | HR 58 | Ht 70.0 in | Wt 216.2 lb

## 2020-07-27 DIAGNOSIS — R7303 Prediabetes: Secondary | ICD-10-CM

## 2020-07-27 DIAGNOSIS — I4891 Unspecified atrial fibrillation: Secondary | ICD-10-CM

## 2020-07-27 DIAGNOSIS — E785 Hyperlipidemia, unspecified: Secondary | ICD-10-CM

## 2020-07-27 DIAGNOSIS — Z7901 Long term (current) use of anticoagulants: Secondary | ICD-10-CM

## 2020-07-27 DIAGNOSIS — I2510 Atherosclerotic heart disease of native coronary artery without angina pectoris: Secondary | ICD-10-CM

## 2020-07-27 NOTE — Patient Instructions (Signed)
Medication Instructions:  Your physician recommends that you continue on your current medications as directed. Please refer to the Current Medication list given to you today.  *If you need a refill on your cardiac medications before your next appointment, please call your pharmacy*   Lab Work: NONE  Testing/Procedures: NONE   Follow-Up: At [facility], you and your health needs are our priority.  As part of our continuing mission to provide you with exceptional heart care, we have created designated Provider Care Teams.  These Care Teams include your primary Cardiologist (physician) and Advanced Practice Providers (APPs -  Physician Assistants and Nurse Practitioners) who all work together to provide you with the care you need, when you need it.  We recommend signing up for the patient portal called "MyChart".  Sign up information is provided on this After Visit Summary.  MyChart is used to connect with patients for Virtual Visits (Telemedicine).  Patients are able to view lab/test results, encounter notes, upcoming appointments, etc.  Non-urgent messages can be sent to your provider as well.   To learn more about what you can do with MyChart, go to [email or web address].    Your next appointment:   1 year(s)  The format for your next appointment:   In Person  Provider:   You may see Larae Grooms, MD or one of the following Advanced Practice Providers on your designated Care Team:    Melina Copa, PA-C  Ermalinda Barrios, PA-C    Other Instructions  High-Fiber Eating Plan Fiber, also called dietary fiber, is a type of carbohydrate. It is found foods such as fruits, vegetables, whole grains, and beans. A high-fiber diet can have many health benefits. Your health care provider may recommend a high-fiber diet to help:  Prevent constipation. Fiber can make your bowel movements more regular.  Lower your cholesterol.  Relieve the following conditions: ? Inflammation of veins  in the anus (hemorrhoids). ? Inflammation of specific areas of the digestive tract (uncomplicated diverticulosis). ? A problem of the large intestine, also called the colon, that sometimes causes pain and diarrhea (irritable bowel syndrome, or IBS).  Prevent overeating as part of a weight-loss plan.  Prevent heart disease, type 2 diabetes, and certain cancers. What are tips for following this plan? Reading food labels  Check the nutrition facts label on food products for the amount of dietary fiber. Choose foods that have 5 grams of fiber or more per serving.  The goals for recommended daily fiber intake include: ? Men (age 33 or younger): 34-38 g. ? Men (over age 75): 28-34 g. ? Women (age 3 or younger): 25-28 g. ? Women (over age 47): 22-25 g. Your daily fiber goal is _____________ g.   Shopping  Choose whole fruits and vegetables instead of processed forms, such as apple juice or applesauce.  Choose a wide variety of high-fiber foods such as avocados, lentils, oats, and kidney beans.  Read the nutrition facts label of the foods you choose. Be aware of foods with added fiber. These foods often have high sugar and sodium amounts per serving. Cooking  Use whole-grain flour for baking and cooking.  Cook with brown rice instead of white rice. Meal planning  Start the day with a breakfast that is high in fiber, such as a cereal that contains 5 g of fiber or more per serving.  Eat breads and cereals that are made with whole-grain flour instead of refined flour or white flour.  Eat brown rice, bulgur wheat, or  millet instead of white rice.  Use beans in place of meat in soups, salads, and pasta dishes.  Be sure that half of the grains you eat each day are whole grains. General information  You can get the recommended daily intake of dietary fiber by: ? Eating a variety of fruits, vegetables, grains, nuts, and beans. ? Taking a fiber supplement if you are not able to take in  enough fiber in your diet. It is better to get fiber through food than from a supplement.  Gradually increase how much fiber you consume. If you increase your intake of dietary fiber too quickly, you may have bloating, cramping, or gas.  Drink plenty of water to help you digest fiber.  Choose high-fiber snacks, such as berries, raw vegetables, nuts, and popcorn. What foods should I eat? Fruits Berries. Pears. Apples. Oranges. Avocado. Prunes and raisins. Dried figs. Vegetables Sweet potatoes. Spinach. Kale. Artichokes. Cabbage. Broccoli. Cauliflower. Green peas. Carrots. Squash. Grains Whole-grain breads. Multigrain cereal. Oats and oatmeal. Brown rice. Barley. Bulgur wheat. [place]. Quinoa. Bran muffins. Popcorn. Rye wafer crackers. Meats and other proteins Navy beans, kidney beans, and pinto beans. Soybeans. Split peas. Lentils. Nuts and seeds. Dairy Fiber-fortified yogurt. Beverages Fiber-fortified soy milk. Fiber-fortified orange juice. Other foods Fiber bars. The items listed above may not be a complete list of recommended foods and beverages. Contact a dietitian for more information. What foods should I avoid? Fruits Fruit juice. Cooked, strained fruit. Vegetables Fried potatoes. Canned vegetables. Well-cooked vegetables. Grains White bread. Pasta made with refined flour. White rice. Meats and other proteins Fatty cuts of meat. Fried chicken or fried fish. Dairy Milk. Yogurt. Cream cheese. Sour cream. Fats and oils Butters. Beverages Soft drinks. Other foods Cakes and pastries. The items listed above may not be a complete list of foods and beverages to avoid. Talk with your dietitian about what choices are best for you. Summary  Fiber is a type of carbohydrate. It is found in foods such as fruits, vegetables, whole grains, and beans.  A high-fiber diet has many benefits. It can help to prevent constipation, lower blood cholesterol, aid weight loss, and reduce your  risk of heart disease, diabetes, and certain cancers.  Increase your intake of fiber gradually. Increasing fiber too quickly may cause cramping, bloating, and gas. Drink plenty of water while you increase the amount of fiber you consume.  The best sources of fiber include whole fruits and vegetables, whole grains, nuts, seeds, and beans. This information is not intended to replace advice given to you by your health care provider. Make sure you discuss any questions you have with your health care provider. Document Revised: 10/24/2019 Document Reviewed: 10/24/2019 Elsevier Patient Education  2021 [employer].

## 2020-08-20 DIAGNOSIS — Z1211 Encounter for screening for malignant neoplasm of colon: Secondary | ICD-10-CM | POA: Diagnosis not present

## 2020-09-09 ENCOUNTER — Other Ambulatory Visit: Payer: Self-pay

## 2020-09-09 ENCOUNTER — Telehealth: Payer: Self-pay | Admitting: *Deleted

## 2020-09-09 DIAGNOSIS — Z7901 Long term (current) use of anticoagulants: Secondary | ICD-10-CM | POA: Diagnosis not present

## 2020-09-09 DIAGNOSIS — Z1211 Encounter for screening for malignant neoplasm of colon: Secondary | ICD-10-CM | POA: Diagnosis not present

## 2020-09-09 MED ORDER — METOPROLOL TARTRATE 25 MG PO TABS: 25.0000 mg | ORAL_TABLET | Freq: Every day | ORAL | 3 refills | Status: DC

## 2020-09-09 NOTE — Telephone Encounter (Signed)
   [facility] Pre-operative Risk Assessment    HEARTCARE STAFF: - Please ensure there is not already an duplicate clearance open for this procedure. - Under Visit Info/Reason for Call, type in Other and utilize the format Clearance MM/DD/YY or Clearance TBD. Do not use dashes or single digits. - If request is for dental extraction, please clarify the # of teeth to be extracted.  Request for surgical clearance:  1. What type of surgery is being performed?  COLONOSCOPY   2. When is this surgery scheduled?  10/22/20   3. What type of clearance is required (medical clearance vs. Pharmacy clearance to hold med vs. Both)?  BOTH  4. Are there any medications that need to be held prior to surgery and how long? ELIQUIS   5. Practice name and name of physician performing surgery?  [facility] / DR. Michail Sermon   6. What is the office phone number?  [phone]   7.   What is the office fax number?  [phone]  8.   Anesthesia type (None, local, MAC, general) ?     Jeanann Lewandowsky 09/09/2020, 3:56 PM  _________________________________________________________________   (provider comments below)

## 2020-09-10 ENCOUNTER — Other Ambulatory Visit: Payer: Self-pay

## 2020-09-10 MED ORDER — APIXABAN 5 MG PO TABS: ORAL_TABLET | ORAL | 1 refills | Status: DC

## 2020-09-10 NOTE — Telephone Encounter (Signed)
Patient with diagnosis of afib on Eliquis for anticoagulation.    Procedure: colonoscopy Date of procedure: 10/22/20  CHA2DS2-VASc Score = 4  This indicates a 4.8% annual risk of stroke. The patient's score is based upon: CHF History: No HTN History: Yes Diabetes History: No Stroke History: No Vascular Disease History: Yes Age Score: 2 Gender Score: 0  CrCl 60mL/min Platelet count 154K (in 2018)  Per office protocol, patient can hold Eliquis for 2 days prior to procedure.

## 2020-09-10 NOTE — Telephone Encounter (Signed)
Refill request received by fax from [facility] order pharmacy.  Pt last saw Dr Irish Lack 07/27/20, last labs 07/13/20 Creat 1.05 at [facility] per [place], age 75, weight 98.1kg, based on specified criteria pt is on appropriate dosage of Eliquis 5mg  BID.  Will refill rx.

## 2020-09-10 NOTE — Telephone Encounter (Signed)
   Primary Cardiologist: Larae Grooms, MD  Chart reviewed as part of pre-operative protocol coverage. Given past medical history and time since last visit, based on ACC/AHA guidelines, Arthur Gardner would be at acceptable risk for the planned procedure without further cardiovascular testing.   Patient with diagnosis of afib on Eliquis for anticoagulation.    Procedure: colonoscopy Date of procedure: 10/22/20  CHA2DS2-VASc Score = 4  This indicates a 4.8% annual risk of stroke. The patient's score is based upon: CHF History: No HTN History: Yes Diabetes History: No Stroke History: No Vascular Disease History: Yes Age Score: 2 Gender Score: 0  CrCl 39mL/min Platelet count 154K (in 2018)  Per office protocol, patient can hold Eliquis for 2 days prior to procedure.  I will route this recommendation to the requesting party via Epic fax function and remove from pre-op pool.  Please call with questions.  [name]. Hillari Zumwalt NP-C    09/10/2020, 9:39 AM [address] Office [phone] Fax [phone]

## 2020-09-15 ENCOUNTER — Other Ambulatory Visit: Payer: Self-pay

## 2020-09-15 MED ORDER — LISINOPRIL 5 MG PO TABS: 5.0000 mg | ORAL_TABLET | Freq: Every day | ORAL | 2 refills | Status: DC

## 2020-09-15 MED ORDER — SIMVASTATIN 40 MG PO TABS: ORAL_TABLET | ORAL | 2 refills | Status: DC

## 2020-10-05 DIAGNOSIS — H11003 Unspecified pterygium of eye, bilateral: Secondary | ICD-10-CM | POA: Diagnosis not present

## 2020-10-05 DIAGNOSIS — H43813 Vitreous degeneration, bilateral: Secondary | ICD-10-CM | POA: Diagnosis not present

## 2020-10-05 DIAGNOSIS — R7309 Other abnormal glucose: Secondary | ICD-10-CM | POA: Diagnosis not present

## 2020-10-05 DIAGNOSIS — H524 Presbyopia: Secondary | ICD-10-CM | POA: Diagnosis not present

## 2020-10-05 DIAGNOSIS — H40013 Open angle with borderline findings, low risk, bilateral: Secondary | ICD-10-CM | POA: Diagnosis not present

## 2020-10-22 DIAGNOSIS — Z1211 Encounter for screening for malignant neoplasm of colon: Secondary | ICD-10-CM | POA: Diagnosis not present

## 2020-10-22 DIAGNOSIS — K635 Polyp of colon: Secondary | ICD-10-CM | POA: Diagnosis not present

## 2020-10-22 DIAGNOSIS — K640 First degree hemorrhoids: Secondary | ICD-10-CM | POA: Diagnosis not present

## 2020-10-27 DIAGNOSIS — K635 Polyp of colon: Secondary | ICD-10-CM | POA: Diagnosis not present

## 2020-12-01 ENCOUNTER — Ambulatory Visit: Payer: PPO | Admitting: Physician Assistant

## 2020-12-01 ENCOUNTER — Other Ambulatory Visit: Payer: Self-pay

## 2020-12-01 ENCOUNTER — Encounter: Payer: Self-pay | Admitting: Physician Assistant

## 2020-12-01 DIAGNOSIS — C44329 Squamous cell carcinoma of skin of other parts of face: Secondary | ICD-10-CM

## 2020-12-01 DIAGNOSIS — L821 Other seborrheic keratosis: Secondary | ICD-10-CM | POA: Diagnosis not present

## 2020-12-01 DIAGNOSIS — Z1283 Encounter for screening for malignant neoplasm of skin: Secondary | ICD-10-CM

## 2020-12-01 DIAGNOSIS — L570 Actinic keratosis: Secondary | ICD-10-CM | POA: Diagnosis not present

## 2020-12-01 DIAGNOSIS — C44619 Basal cell carcinoma of skin of left upper limb, including shoulder: Secondary | ICD-10-CM

## 2020-12-01 DIAGNOSIS — C4492 Squamous cell carcinoma of skin, unspecified: Secondary | ICD-10-CM

## 2020-12-01 DIAGNOSIS — D485 Neoplasm of uncertain behavior of skin: Secondary | ICD-10-CM | POA: Diagnosis not present

## 2020-12-01 DIAGNOSIS — C4491 Basal cell carcinoma of skin, unspecified: Secondary | ICD-10-CM

## 2020-12-01 HISTORY — DX: Squamous cell carcinoma of skin, unspecified: C44.92

## 2020-12-01 HISTORY — DX: Basal cell carcinoma of skin, unspecified: C44.91

## 2020-12-01 NOTE — Progress Notes (Signed)
Follow-Up Visit   Subjective  Arthur Gardner is a 75 y.o. male who presents for the following: Annual Exam (Yearly exam per patient we are to start on his back).   The following portions of the chart were reviewed this encounter and updated as appropriate:  Tobacco  Allergies  Meds  Problems  Med Hx  Surg Hx  Fam Hx      Objective  Well appearing patient in no apparent distress; mood and affect are within normal limits.  All skin waist up examined.  Objective  Left Mid Helix, Left Zygomatic Area, Mid Forehead, Mid Frontal Scalp, Right Malar Cheek, Right Supraclavicular Area (4): Erythematous patches with gritty scale.  Objective  Left Shoulder - Lateral: Pearly papule with telangectasia.        Objective  Left shoulder medial: Hyperkeratotic scale with pink base         Objective  Left Forehead: Hyperkeratotic scale with pink base       Assessment & Plan  AK (actinic keratosis) (9) Left Mid Helix; Mid Frontal Scalp; Mid Forehead; Left Zygomatic Area; Right Malar Cheek; Right Supraclavicular Area (4)  Destruction of lesion - Left Mid Helix, Left Zygomatic Area, Mid Forehead, Mid Frontal Scalp, Right Malar Cheek, Right Supraclavicular Area (4) Complexity: simple   Destruction method: cryotherapy   Informed consent: discussed and consent obtained   Timeout:  patient name, date of birth, surgical site, and procedure verified Lesion destroyed using liquid nitrogen: Yes   Cryotherapy cycles:  1 Outcome: patient tolerated procedure well with no complications   Post-procedure details: wound care instructions given    Neoplasm of uncertain behavior of skin (3) Left Shoulder - Lateral  Skin / nail biopsy Type of biopsy: tangential   Informed consent: discussed and consent obtained   Timeout: patient name, date of birth, surgical site, and procedure verified   Procedure prep:  Patient was prepped and draped in usual sterile fashion (Non sterile) Prep  type:  Chlorhexidine Anesthesia: the lesion was anesthetized in a standard fashion   Anesthetic:  1% lidocaine w/ epinephrine 1-100,000 local infiltration Instrument used: flexible razor blade   Outcome: patient tolerated procedure well   Post-procedure details: wound care instructions given    Specimen 1 - Surgical pathology Differential Diagnosis: bcc vs scc  Check Margins: yes  Left shoulder medial  Skin / nail biopsy Type of biopsy: tangential   Informed consent: discussed and consent obtained   Timeout: patient name, date of birth, surgical site, and procedure verified   Procedure prep:  Patient was prepped and draped in usual sterile fashion (Non sterile) Prep type:  Chlorhexidine Anesthesia: the lesion was anesthetized in a standard fashion   Anesthetic:  1% lidocaine w/ epinephrine 1-100,000 local infiltration Instrument used: flexible razor blade   Outcome: patient tolerated procedure well   Post-procedure details: wound care instructions given    Specimen 2 - Surgical pathology Differential Diagnosis: bcc vs scc  Check Margins: No  Left Forehead  Skin / nail biopsy Type of biopsy: tangential   Informed consent: discussed and consent obtained   Timeout: patient name, date of birth, surgical site, and procedure verified   Procedure prep:  Patient was prepped and draped in usual sterile fashion (Non sterile) Prep type:  Chlorhexidine Anesthesia: the lesion was anesthetized in a standard fashion   Anesthetic:  1% lidocaine w/ epinephrine 1-100,000 local infiltration Instrument used: flexible razor blade   Outcome: patient tolerated procedure well   Post-procedure details: wound care  instructions given    Specimen 3 - Surgical pathology Differential Diagnosis: bcc vs scc  Check Margins: No    I, Carynn Felling, PA-C, have reviewed all documentation's for this visit.  The documentation on 12/01/20 for the exam, diagnosis, procedures and orders are all accurate  and complete.

## 2020-12-01 NOTE — Patient Instructions (Signed)

## 2020-12-10 NOTE — Progress Notes (Signed)
2- Arthur Gardner  3 mohs

## 2020-12-14 ENCOUNTER — Telehealth: Payer: Self-pay

## 2020-12-14 NOTE — Telephone Encounter (Signed)
-----   Message from Warren Danes, [place] sent at 12/10/2020  9:00 AM EDT ----- 2- Vida Roller  3 mohs

## 2020-12-14 NOTE — Telephone Encounter (Signed)
Phone call to patient with his pathology results. Voicemail left for patient to give the office a call back.  ?

## 2020-12-15 ENCOUNTER — Telehealth: Payer: Self-pay

## 2020-12-15 NOTE — Telephone Encounter (Signed)
Phone call to patient to inform him that Vida Roller does what him to go to [facility] for his forehead and she'll treat his shoulder. Voicemail left for patient to give the office a call back.

## 2020-12-15 NOTE — Telephone Encounter (Signed)
Phone call from the patient returning our call regarding Kelli's recommendations.  Patient aware of Kelli's recommendations, info sent to [facility].

## 2020-12-15 NOTE — Telephone Encounter (Signed)
-----   Message from Warren Danes, [place] sent at 12/10/2020  9:00 AM EDT ----- 2- Vida Roller  3 mohs

## 2021-01-25 DIAGNOSIS — C44329 Squamous cell carcinoma of skin of other parts of face: Secondary | ICD-10-CM | POA: Diagnosis not present

## 2021-03-03 ENCOUNTER — Telehealth: Payer: Self-pay | Admitting: *Deleted

## 2021-03-03 MED ORDER — APIXABAN 5 MG PO TABS: ORAL_TABLET | ORAL | 1 refills | Status: DC

## 2021-03-03 NOTE — Telephone Encounter (Signed)
Eliquis '5mg'$  paper refill request received. Patient is 75 years old, weight-98.1kg, Crea-1.05 on 07/13/20 via [facility] at [place], [place], and last seen by Dr. Irish Lack on 07/27/2020. Dose is appropriate based on dosing criteria. Will send in refill to requested pharmacy.

## 2021-03-25 DIAGNOSIS — D0472 Carcinoma in situ of skin of left lower limb, including hip: Secondary | ICD-10-CM | POA: Diagnosis not present

## 2021-03-25 DIAGNOSIS — Z08 Encounter for follow-up examination after completed treatment for malignant neoplasm: Secondary | ICD-10-CM | POA: Diagnosis not present

## 2021-03-25 DIAGNOSIS — Z85828 Personal history of other malignant neoplasm of skin: Secondary | ICD-10-CM | POA: Diagnosis not present

## 2021-03-25 DIAGNOSIS — L814 Other melanin hyperpigmentation: Secondary | ICD-10-CM | POA: Diagnosis not present

## 2021-03-25 DIAGNOSIS — D225 Melanocytic nevi of trunk: Secondary | ICD-10-CM | POA: Diagnosis not present

## 2021-03-25 DIAGNOSIS — D492 Neoplasm of unspecified behavior of bone, soft tissue, and skin: Secondary | ICD-10-CM | POA: Diagnosis not present

## 2021-03-25 DIAGNOSIS — L821 Other seborrheic keratosis: Secondary | ICD-10-CM | POA: Diagnosis not present

## 2021-03-25 DIAGNOSIS — C44729 Squamous cell carcinoma of skin of left lower limb, including hip: Secondary | ICD-10-CM | POA: Diagnosis not present

## 2021-04-05 DIAGNOSIS — D492 Neoplasm of unspecified behavior of bone, soft tissue, and skin: Secondary | ICD-10-CM | POA: Diagnosis not present

## 2021-04-05 DIAGNOSIS — L821 Other seborrheic keratosis: Secondary | ICD-10-CM | POA: Diagnosis not present

## 2021-04-05 DIAGNOSIS — L570 Actinic keratosis: Secondary | ICD-10-CM | POA: Diagnosis not present

## 2021-06-08 ENCOUNTER — Ambulatory Visit: Payer: PPO | Admitting: Physician Assistant

## 2021-07-22 DIAGNOSIS — G4733 Obstructive sleep apnea (adult) (pediatric): Secondary | ICD-10-CM | POA: Diagnosis not present

## 2021-07-27 NOTE — Progress Notes (Signed)
Cardiology Office Note   Date:  07/28/2021   ID:  Arthur, [name] Gardner, MRN [id number]  PCP:  Shirline Frees, MD    Chief Complaint  Patient presents with   Follow-up    Patient will discuss concerns with the doctor    CAD  Wt Readings from Last 3 Encounters:  07/28/21 214 lb (97.1 kg)  07/27/20 216 lb 3.2 oz (98.1 kg)  07/25/19 208 lb 12.8 oz (94.7 kg)       History of Present Illness: Arthur Gardner is a 76 y.o. male  who has had a DES to the LAD in 3/10.  He had paroxysmal atrial flutter in the past but this was medically managed.  He was diagnosed with AFib.  He had a cardioversion but reverted back to AFib quickly.  He saw EP.  He is going to be managed with rate control and anticoagulation.     Negative nuclear stress test in 8/17.  In 2022, it was noted: "He got COVID vaccines.  He has not had the booster no does he want it.  He would be interested in an Ivermectin treatment"   About one month ago, he had some shortness of breath at night.  He felt his heart pounding.  He wears CPAP.  He had some tightness with deep breathing.  No further episodes of discomfort since that one night.    He continues to walk daily.  No cardiac symptoms.    Denies :  Dizziness. Leg edema. Nitroglycerin use. Orthopnea. Paroxysmal nocturnal dyspnea. Syncope.    Past Medical History:  Diagnosis Date   Atrial flutter ([place])    Basal cell carcinoma 02/11/2015   sup +nod- Right zygoma-(CX35FU)   BPH (benign prostatic hyperplasia)    CAD (coronary artery disease)    status post drug-eluting stent to the left anterior descending.  2/10   Hearing loss    wears hearing aids   HTN (hypertension)    Hypercholesteremia    Peptic ulcer    (remote).    Persistent atrial fibrillation (HCC)    chads2vasc score is 3   SCCA (squamous cell carcinoma) of skin 12/01/2020   Left Forehead (well diff)   Sleep apnea    Squamous cell carcinoma of skin 08/14/2012   right jawline (cx72fu)    Squamous cell carcinoma of skin 10/29/2003    bowens-left wrist, sup-(CX35FU), in situ- left wrist,inf (CX35FU), in situ-mid bridge nose (Cx35FU)   Squamous cell carcinoma of skin 08/02/2013   in situ-left preauricular (txpbx)   Squamous cell carcinoma of skin 10/29/2013   mod diff-right neck (txpbx)   Squamous cell carcinoma of skin 10/22/2014   well diff- Riht jawline (MOHS)   Squamous cell carcinoma of skin 03/27/2018   well diff- right front scalp (watch), in situ-right neck,sup (CX35FU), well diff-right neck, inf (CX35FU)   Squamous cell carcinoma of skin 04/23/2019   KA-right hand (txpbx)   Superficial nodular basal cell carcinoma (BCC) 12/01/2020   Left Shoulder Medial    Past Surgical History:  Procedure Laterality Date   APPENDECTOMY     CARDIOVERSION N/A 01/14/2016   Procedure: CARDIOVERSION;  Surgeon: Fay Records, MD;  Location: [facility] ENDOSCOPY;  Service: Cardiovascular;  Laterality: N/A;   HERNIA REPAIR  2000     Current Outpatient Medications  Medication Sig Dispense Refill   apixaban (ELIQUIS) 5 MG TABS tablet TAKE 1 TABLET(5 MG) BY MOUTH TWICE DAILY 180 tablet 1   Coenzyme Q10 (COQ-10 PO)  Take 200 mg by mouth daily.      Fish Oil OIL Take 2,000 mg by mouth 2 (two) times daily.      lisinopril (ZESTRIL) 5 MG tablet Take 1 tablet (5 mg total) by mouth daily. 90 tablet 2   metoprolol tartrate (LOPRESSOR) 25 MG tablet Take 1 tablet (25 mg total) by mouth daily. 90 tablet 3   simvastatin (ZOCOR) 40 MG tablet TAKE 1 TABLET(40 MG) BY MOUTH DAILY 90 tablet 2   No current facility-administered medications for this visit.    Allergies:   Doxycycline hyclate, Other, and Doxycycline hyclate    Social History:  The patient  reports that he has quit smoking. He has never used smokeless tobacco. He reports current alcohol use. He reports that he does not use drugs.   Family History:  The patient's family history includes Heart disease in his father; Hypertension in his mother.     ROS:  Please see the history of present illness.   Otherwise, review of systems are positive for epside of palpitations as noted above.   All other systems are reviewed and negative.    PHYSICAL EXAM: VS:  BP 106/68    Pulse 74    Ht 5\' 10"  (1.778 m)    Wt 214 lb (97.1 kg)    SpO2 97%    BMI 30.71 kg/m  , BMI Body mass index is 30.71 kg/m. GEN: Well nourished, well developed, in no acute distress HEENT: normal Neck: no JVD, carotid bruits, or masses Cardiac: irregularly irregular; no murmurs, rubs, or gallops,no edema  Respiratory:  clear to auscultation bilaterally, normal work of breathing GI: soft, nontender, nondistended, + BS MS: no deformity or atrophy Skin: warm and dry, no rash Neuro:  Strength and sensation are intact Psych: euthymic mood, full affect   EKG:   The ekg ordered today demonstrates AFib, rate controlled; increased R/S ratio- similar to prior   Recent Labs: No results found for requested labs within last 8760 hours.   Lipid Panel    Component Value Date/Time   CHOL 140 06/06/2017 1111   TRIG 113 06/06/2017 1111   HDL 35 (L) 06/06/2017 1111   CHOLHDL 4.0 06/06/2017 1111   CHOLHDL 4 03/10/2015 0829   VLDL 17.4 03/10/2015 0829   LDLCALC 82 06/06/2017 1111     Other studies Reviewed: Additional studies/ records that were reviewed today with results demonstrating: prior stress test reviewed; labs from Dr. Kenton Kingfisher pending.   ASSESSMENT AND PLAN:  CAD: COntinue aggressive secondary prevention. Given sx, will plan for stress test.  LAD stent 13 years ago.   Atrial fibrillation: Eliquis for stroke prevention in the setting of acquired thrombophilia.  Rate controlled.  Mitral regurgitation: No CHF sx. Appears euvolemic. Hyperlipidemia: COntinue statin.  Anticoagulation: No bleeding problems.  FOllow CBC and BMet.  Prediabetes: A1C 5.9 in 2022. COntinue whole food, plant based diet.    Current medicines are reviewed at length with the patient today.   The patient concerns regarding his medicines were addressed.  The following changes have been made:  No change  Labs/ tests ordered today include: nuclear stress test No orders of the defined types were placed in this encounter.   Recommend 150 minutes/week of aerobic exercise Low fat, low carb, high fiber diet recommended  Disposition:   FU in 1 year   Signed, Larae Grooms, MD  07/28/2021 4:48 PM    [facility] [address], [place], [place]  [address] Phone: [phone]; Fax: [phone]

## 2021-07-28 ENCOUNTER — Encounter: Payer: Self-pay | Admitting: Interventional Cardiology

## 2021-07-28 ENCOUNTER — Ambulatory Visit: Payer: PPO | Admitting: Interventional Cardiology

## 2021-07-28 ENCOUNTER — Other Ambulatory Visit: Payer: Self-pay

## 2021-07-28 ENCOUNTER — Encounter: Payer: Self-pay | Admitting: *Deleted

## 2021-07-28 VITALS — BP 106/68 | HR 74 | Ht 70.0 in | Wt 214.0 lb

## 2021-07-28 DIAGNOSIS — R7303 Prediabetes: Secondary | ICD-10-CM | POA: Diagnosis not present

## 2021-07-28 DIAGNOSIS — I25118 Atherosclerotic heart disease of native coronary artery with other forms of angina pectoris: Secondary | ICD-10-CM

## 2021-07-28 DIAGNOSIS — D6869 Other thrombophilia: Secondary | ICD-10-CM

## 2021-07-28 DIAGNOSIS — I10 Essential (primary) hypertension: Secondary | ICD-10-CM | POA: Diagnosis not present

## 2021-07-28 DIAGNOSIS — G4733 Obstructive sleep apnea (adult) (pediatric): Secondary | ICD-10-CM | POA: Diagnosis not present

## 2021-07-28 DIAGNOSIS — Z7901 Long term (current) use of anticoagulants: Secondary | ICD-10-CM | POA: Diagnosis not present

## 2021-07-28 DIAGNOSIS — I4891 Unspecified atrial fibrillation: Secondary | ICD-10-CM | POA: Diagnosis not present

## 2021-07-28 DIAGNOSIS — Z0000 Encounter for general adult medical examination without abnormal findings: Secondary | ICD-10-CM | POA: Diagnosis not present

## 2021-07-28 DIAGNOSIS — E7800 Pure hypercholesterolemia, unspecified: Secondary | ICD-10-CM | POA: Diagnosis not present

## 2021-07-28 DIAGNOSIS — E782 Mixed hyperlipidemia: Secondary | ICD-10-CM

## 2021-07-28 DIAGNOSIS — I2510 Atherosclerotic heart disease of native coronary artery without angina pectoris: Secondary | ICD-10-CM | POA: Diagnosis not present

## 2021-07-28 MED ORDER — METOPROLOL TARTRATE 25 MG PO TABS: 25.0000 mg | ORAL_TABLET | Freq: Every day | ORAL | 3 refills | Status: DC

## 2021-07-28 MED ORDER — SIMVASTATIN 40 MG PO TABS
ORAL_TABLET | ORAL | 3 refills | Status: DC
Start: 2021-07-28 — End: 2022-07-11

## 2021-07-28 MED ORDER — APIXABAN 5 MG PO TABS: ORAL_TABLET | ORAL | 11 refills | Status: DC

## 2021-07-28 MED ORDER — LISINOPRIL 5 MG PO TABS: 5.0000 mg | ORAL_TABLET | Freq: Every day | ORAL | 3 refills | Status: DC

## 2021-07-28 NOTE — Patient Instructions (Signed)
Medication Instructions:  Your physician recommends that you continue on your current medications as directed. Please refer to the Current Medication list given to you today.  *If you need a refill on your cardiac medications before your next appointment, please call your pharmacy*   Lab Work: none If you have labs (blood work) drawn today and your tests are completely normal, you will receive your results only by: [place] (if you have MyChart) OR A paper copy in the mail If you have any lab test that is abnormal or we need to change your treatment, we will call you to review the results.   Testing/Procedures: Your physician has requested that you have an echocardiogram. Echocardiography is a painless test that uses sound waves to create images of your heart. It provides your doctor with information about the size and shape of your heart and how well your hearts chambers and valves are working. This procedure takes approximately one hour. There are no restrictions for this procedure.  Your physician has requested that you have a lexiscan myoview. For further information please visit [email or web address]. Please follow instruction sheet, as given.    Follow-Up: At [facility], you and your health needs are our priority.  As part of our continuing mission to provide you with exceptional heart care, we have created designated Provider Care Teams.  These Care Teams include your primary Cardiologist (physician) and Advanced Practice Providers (APPs -  Physician Assistants and Nurse Practitioners) who all work together to provide you with the care you need, when you need it.  We recommend signing up for the patient portal called "MyChart".  Sign up information is provided on this After Visit Summary.  MyChart is used to connect with patients for Virtual Visits (Telemedicine).  Patients are able to view lab/test results, encounter notes, upcoming appointments, etc.  Non-urgent messages  can be sent to your provider as well.   To learn more about what you can do with MyChart, go to [email or web address].    Your next appointment:   12 month(s)  The format for your next appointment:   In Person  Provider:   Larae Grooms, MD     Other Instructions

## 2021-08-09 ENCOUNTER — Telehealth (HOSPITAL_COMMUNITY): Payer: Self-pay | Admitting: *Deleted

## 2021-08-09 DIAGNOSIS — L570 Actinic keratosis: Secondary | ICD-10-CM | POA: Diagnosis not present

## 2021-08-09 DIAGNOSIS — D044 Carcinoma in situ of skin of scalp and neck: Secondary | ICD-10-CM | POA: Diagnosis not present

## 2021-08-09 NOTE — Telephone Encounter (Signed)
Left message on voicemail per DPR in reference to upcoming appointment scheduled on 08/13/2021 at 7:45 with detailed instructions given per Myocardial Perfusion Study Information Sheet for the test. LM to arrive 15 minutes early, and that it is imperative to arrive on time for appointment to keep from having the test rescheduled. If you need to cancel or reschedule your appointment, please call the office within 24 hours of your appointment. Failure to do so may result in a cancellation of your appointment, and a $50 no show fee. Phone number given for call back for any questions.

## 2021-08-13 ENCOUNTER — Ambulatory Visit (HOSPITAL_BASED_OUTPATIENT_CLINIC_OR_DEPARTMENT_OTHER): Payer: PPO

## 2021-08-13 ENCOUNTER — Ambulatory Visit (HOSPITAL_COMMUNITY): Payer: PPO | Attending: Cardiology

## 2021-08-13 ENCOUNTER — Other Ambulatory Visit: Payer: Self-pay

## 2021-08-13 DIAGNOSIS — I4891 Unspecified atrial fibrillation: Secondary | ICD-10-CM

## 2021-08-13 DIAGNOSIS — I25118 Atherosclerotic heart disease of native coronary artery with other forms of angina pectoris: Secondary | ICD-10-CM

## 2021-08-13 LAB — MYOCARDIAL PERFUSION IMAGING
Base ST Depression (mm): 0 mm
LV dias vol: 97 mL (ref 62–150)
LV sys vol: 49 mL
Nuc Stress EF: 50 %
Peak HR: 83 {beats}/min
Rest HR: 60 {beats}/min
Rest Nuclear Isotope Dose: 10.6 mCi
SDS: 0
SRS: 0
SSS: 0
ST Depression (mm): 0 mm
Stress Nuclear Isotope Dose: 31.9 mCi
TID: 0.99

## 2021-08-13 LAB — ECHOCARDIOGRAM COMPLETE
Area-P 1/2: 3.72 cm2
Height: 70 in
MV M vel: 5.22 m/s
MV Peak grad: 109 mmHg
Radius: 0.5 cm
S' Lateral: 3.6 cm
Weight: 3424 oz

## 2021-08-13 MED ORDER — REGADENOSON 0.4 MG/5ML IV SOLN
0.4000 mg | Freq: Once | INTRAVENOUS | Status: AC
  Administered 2021-08-13: 0.4 mg via INTRAVENOUS

## 2021-08-13 MED ORDER — TECHNETIUM TC 99M TETROFOSMIN IV KIT
31.9000 | PACK | Freq: Once | INTRAVENOUS | Status: AC | PRN
  Administered 2021-08-13: 31.9 via INTRAVENOUS
  Filled 2021-08-13: qty 32

## 2021-08-13 MED ORDER — TECHNETIUM TC 99M TETROFOSMIN IV KIT
10.6000 | PACK | Freq: Once | INTRAVENOUS | Status: AC | PRN
  Administered 2021-08-13: 10.6 via INTRAVENOUS
  Filled 2021-08-13: qty 11

## 2021-10-05 DIAGNOSIS — H40013 Open angle with borderline findings, low risk, bilateral: Secondary | ICD-10-CM | POA: Diagnosis not present

## 2021-10-05 DIAGNOSIS — H04123 Dry eye syndrome of bilateral lacrimal glands: Secondary | ICD-10-CM | POA: Diagnosis not present

## 2021-10-05 DIAGNOSIS — H35033 Hypertensive retinopathy, bilateral: Secondary | ICD-10-CM | POA: Diagnosis not present

## 2021-10-05 DIAGNOSIS — H26492 Other secondary cataract, left eye: Secondary | ICD-10-CM | POA: Diagnosis not present

## 2021-12-31 DIAGNOSIS — L03116 Cellulitis of left lower limb: Secondary | ICD-10-CM | POA: Diagnosis not present

## 2022-01-11 DIAGNOSIS — H903 Sensorineural hearing loss, bilateral: Secondary | ICD-10-CM | POA: Diagnosis not present

## 2022-02-07 DIAGNOSIS — L821 Other seborrheic keratosis: Secondary | ICD-10-CM | POA: Diagnosis not present

## 2022-02-07 DIAGNOSIS — C44329 Squamous cell carcinoma of skin of other parts of face: Secondary | ICD-10-CM | POA: Diagnosis not present

## 2022-02-07 DIAGNOSIS — R208 Other disturbances of skin sensation: Secondary | ICD-10-CM | POA: Diagnosis not present

## 2022-02-07 DIAGNOSIS — L728 Other follicular cysts of the skin and subcutaneous tissue: Secondary | ICD-10-CM | POA: Diagnosis not present

## 2022-02-07 DIAGNOSIS — D225 Melanocytic nevi of trunk: Secondary | ICD-10-CM | POA: Diagnosis not present

## 2022-02-07 DIAGNOSIS — Z85828 Personal history of other malignant neoplasm of skin: Secondary | ICD-10-CM | POA: Diagnosis not present

## 2022-02-07 DIAGNOSIS — Z08 Encounter for follow-up examination after completed treatment for malignant neoplasm: Secondary | ICD-10-CM | POA: Diagnosis not present

## 2022-02-07 DIAGNOSIS — Z86007 Personal history of in-situ neoplasm of skin: Secondary | ICD-10-CM | POA: Diagnosis not present

## 2022-02-07 DIAGNOSIS — L814 Other melanin hyperpigmentation: Secondary | ICD-10-CM | POA: Diagnosis not present

## 2022-02-07 DIAGNOSIS — L538 Other specified erythematous conditions: Secondary | ICD-10-CM | POA: Diagnosis not present

## 2022-02-07 DIAGNOSIS — D492 Neoplasm of unspecified behavior of bone, soft tissue, and skin: Secondary | ICD-10-CM | POA: Diagnosis not present

## 2022-02-24 DIAGNOSIS — L308 Other specified dermatitis: Secondary | ICD-10-CM | POA: Diagnosis not present

## 2022-02-24 DIAGNOSIS — C44329 Squamous cell carcinoma of skin of other parts of face: Secondary | ICD-10-CM | POA: Diagnosis not present

## 2022-04-05 DIAGNOSIS — L821 Other seborrheic keratosis: Secondary | ICD-10-CM | POA: Diagnosis not present

## 2022-04-05 DIAGNOSIS — L570 Actinic keratosis: Secondary | ICD-10-CM | POA: Diagnosis not present

## 2022-04-05 DIAGNOSIS — L578 Other skin changes due to chronic exposure to nonionizing radiation: Secondary | ICD-10-CM | POA: Diagnosis not present

## 2022-07-11 ENCOUNTER — Other Ambulatory Visit: Payer: Self-pay | Admitting: Interventional Cardiology

## 2022-07-27 DIAGNOSIS — G4733 Obstructive sleep apnea (adult) (pediatric): Secondary | ICD-10-CM | POA: Diagnosis not present

## 2022-08-08 DIAGNOSIS — Z08 Encounter for follow-up examination after completed treatment for malignant neoplasm: Secondary | ICD-10-CM | POA: Diagnosis not present

## 2022-08-08 DIAGNOSIS — C44329 Squamous cell carcinoma of skin of other parts of face: Secondary | ICD-10-CM | POA: Diagnosis not present

## 2022-08-08 DIAGNOSIS — L821 Other seborrheic keratosis: Secondary | ICD-10-CM | POA: Diagnosis not present

## 2022-08-08 DIAGNOSIS — L728 Other follicular cysts of the skin and subcutaneous tissue: Secondary | ICD-10-CM | POA: Diagnosis not present

## 2022-08-08 DIAGNOSIS — D492 Neoplasm of unspecified behavior of bone, soft tissue, and skin: Secondary | ICD-10-CM | POA: Diagnosis not present

## 2022-08-08 DIAGNOSIS — L570 Actinic keratosis: Secondary | ICD-10-CM | POA: Diagnosis not present

## 2022-08-08 DIAGNOSIS — Z85828 Personal history of other malignant neoplasm of skin: Secondary | ICD-10-CM | POA: Diagnosis not present

## 2022-08-08 DIAGNOSIS — D225 Melanocytic nevi of trunk: Secondary | ICD-10-CM | POA: Diagnosis not present

## 2022-08-08 DIAGNOSIS — L814 Other melanin hyperpigmentation: Secondary | ICD-10-CM | POA: Diagnosis not present

## 2022-08-11 DIAGNOSIS — I4891 Unspecified atrial fibrillation: Secondary | ICD-10-CM | POA: Diagnosis not present

## 2022-08-11 DIAGNOSIS — Z0000 Encounter for general adult medical examination without abnormal findings: Secondary | ICD-10-CM | POA: Diagnosis not present

## 2022-08-11 DIAGNOSIS — R7303 Prediabetes: Secondary | ICD-10-CM | POA: Diagnosis not present

## 2022-08-11 DIAGNOSIS — I10 Essential (primary) hypertension: Secondary | ICD-10-CM | POA: Diagnosis not present

## 2022-08-11 DIAGNOSIS — E7800 Pure hypercholesterolemia, unspecified: Secondary | ICD-10-CM | POA: Diagnosis not present

## 2022-08-11 DIAGNOSIS — G4733 Obstructive sleep apnea (adult) (pediatric): Secondary | ICD-10-CM | POA: Diagnosis not present

## 2022-08-11 DIAGNOSIS — I2510 Atherosclerotic heart disease of native coronary artery without angina pectoris: Secondary | ICD-10-CM | POA: Diagnosis not present

## 2022-08-11 DIAGNOSIS — D6869 Other thrombophilia: Secondary | ICD-10-CM | POA: Diagnosis not present

## 2022-10-05 NOTE — Progress Notes (Unsigned)
Cardiology Office Note   Date:  10/06/2022   ID:  Arthur Gardner, DOB 1945/09/14, MRN [id number]  PCP:  Shirline Frees, MD    No chief complaint on file.  CAD  Wt Readings from Last 3 Encounters:  10/06/22 186 lb 12.8 oz (84.7 kg)  08/13/21 214 lb (97.1 kg)  07/28/21 214 lb (97.1 kg)       History of Present Illness: Arthur Gardner is a 77 y.o. male    who has had a DES to the LAD in 3/10.  He had paroxysmal atrial flutter in the past but this was medically managed.  He was diagnosed with AFib.  He had a cardioversion but reverted back to AFib quickly.  He saw EP.  He is going to be managed with rate control and anticoagulation.     Negative nuclear stress test in 8/17.   In 2022, it was noted: "He got COVID vaccines.  He has not had the booster no does he want it.  He would be interested in an Ivermectin treatment"   In 2023:" he had some shortness of breath at night.  He felt his heart pounding.  He wears CPAP.  He had some tightness with deep breathing.  No further episodes of discomfort since that one night.   He continues to walk daily.  No cardiac symptoms. "  In 2024, he notes some vertigo.  No falls.  Denies : Chest pain. Dizziness. Leg edema. Nitroglycerin use. Orthopnea. Palpitations. Paroxysmal nocturnal dyspnea. Shortness of breath. Syncope.    No bleeding problems.  Home BP readings are in the 110/70 range.   Overall, feels that he is doing very well.   Past Medical History:  Diagnosis Date   Atrial flutter    Basal cell carcinoma 02/11/2015   sup +nod- Right zygoma-(CX35FU)   BPH (benign prostatic hyperplasia)    CAD (coronary artery disease)    status post drug-eluting stent to the left anterior descending.  2/10   Hearing loss    wears hearing aids   HTN (hypertension)    Hypercholesteremia    Peptic ulcer    (remote).    Persistent atrial fibrillation    chads2vasc score is 3   SCCA (squamous cell carcinoma) of skin 12/01/2020   Left Forehead (well  diff)   Sleep apnea    Squamous cell carcinoma of skin 08/14/2012   right jawline (cx32fu)   Squamous cell carcinoma of skin 10/29/2003    bowens-left wrist, sup-(CX35FU), in situ- left wrist,inf (CX35FU), in situ-mid bridge nose (Cx35FU)   Squamous cell carcinoma of skin 08/02/2013   in situ-left preauricular (txpbx)   Squamous cell carcinoma of skin 10/29/2013   mod diff-right neck (txpbx)   Squamous cell carcinoma of skin 10/22/2014   well diff- Riht jawline (MOHS)   Squamous cell carcinoma of skin 03/27/2018   well diff- right front scalp (watch), in situ-right neck,sup (CX35FU), well diff-right neck, inf (CX35FU)   Squamous cell carcinoma of skin 04/23/2019   KA-right hand (txpbx)   Superficial nodular basal cell carcinoma (BCC) 12/01/2020   Left Shoulder Medial    Past Surgical History:  Procedure Laterality Date   APPENDECTOMY     CARDIOVERSION N/A 01/14/2016   Procedure: CARDIOVERSION;  Surgeon: Fay Records, MD;  Location: [facility] ENDOSCOPY;  Service: Cardiovascular;  Laterality: N/A;   HERNIA REPAIR  2000     Current Outpatient Medications  Medication Sig Dispense Refill   apixaban (ELIQUIS) 5 MG TABS  tablet TAKE 1 TABLET(5 MG) BY MOUTH TWICE DAILY 60 tablet 11   Coenzyme Q10 (COQ-10 PO) Take 200 mg by mouth daily.      Fish Oil OIL Take 2,000 mg by mouth 2 (two) times daily.      lisinopril (ZESTRIL) 5 MG tablet TAKE 1 TABLET (5 MG TOTAL) BY MOUTH DAILY. 30 tablet 0   metoprolol tartrate (LOPRESSOR) 25 MG tablet TAKE 1 TABLET (25 MG TOTAL) BY MOUTH DAILY. 30 tablet 0   simvastatin (ZOCOR) 40 MG tablet TAKE 1 TABLET BY MOUTH EVERY DAY 30 tablet 0   No current facility-administered medications for this visit.    Allergies:   Doxycycline hyclate, Doxycycline hyclate, and Other    Social History:  The patient  reports that he has quit smoking. He has never used smokeless tobacco. He reports current alcohol use. He reports that he does not use drugs.   Family History:   The patient's family history includes Heart disease in his father; Hypertension in his mother.    ROS:  Please see the history of present illness.   Otherwise, review of systems are positive for vertigo.   All other systems are reviewed and negative.    PHYSICAL EXAM: VS:  BP 126/72   Pulse (!) 50   Ht 5\' 8"  (1.727 m)   Wt 186 lb 12.8 oz (84.7 kg)   SpO2 96%   BMI 28.40 kg/m  , BMI Body mass index is 28.4 kg/m. GEN: Well nourished, well developed, in no acute distress HEENT: normal Neck: no JVD, carotid bruits, or masses Cardiac: RRR; no murmurs, rubs, or gallops,no edema  Respiratory:  clear to auscultation bilaterally, normal work of breathing GI: soft, nontender, nondistended, + BS MS: no deformity or atrophy Skin: warm and dry, no rash Neuro:  Strength and sensation are intact Psych: euthymic mood, full affect   EKG:   The ekg ordered today demonstrates AFib,rate controlled   Recent Labs: No results found for requested labs within last 365 days.   Lipid Panel    Component Value Date/Time   CHOL 140 06/06/2017 1111   TRIG 113 06/06/2017 1111   HDL 35 (L) 06/06/2017 1111   CHOLHDL 4.0 06/06/2017 1111   CHOLHDL 4 03/10/2015 0829   VLDL 17.4 03/10/2015 0829   LDLCALC 82 06/06/2017 1111     Other studies Reviewed: Additional studies/ records that were reviewed today with results demonstrating: Labs reviewed.   ASSESSMENT AND PLAN:  CAD: prior LAD stent.  No angina.  COntinue aggressive secondary prevention. No bleeding.   Atrial fibrillation: Eliquis for stroke prevention in the setting of acquired thrombophilia. Mitral regurgitation: No CHF.  Hyperlipidemia: LDL 79. We discussed switching Zocor to rosuvastatin 20 mg daily for better lipid lowering effect.  Liver and lipids to be checked in 3 months. Anticoagulation: No bleeding issues.  Prediabetes: A1c 5.9 in 2022.  Whole food, plant-based diet.A1C  down to 5.7 in 2024.   Current medicines are reviewed at  length with the patient today.  The patient concerns regarding his medicines were addressed.  The following changes have been made:  No change  Labs/ tests ordered today include:  No orders of the defined types were placed in this encounter.   Recommend 150 minutes/week of aerobic exercise Low fat, low carb, high fiber diet recommended  Disposition:   FU in 1 year   Signed, Larae Grooms, MD  10/06/2022 4:36 PM    [facility]  [address] [place], [place]  [address] Phone: [phone]; Fax: [phone]

## 2022-10-06 ENCOUNTER — Encounter: Payer: Self-pay | Admitting: Interventional Cardiology

## 2022-10-06 ENCOUNTER — Ambulatory Visit: Payer: PPO | Attending: Interventional Cardiology | Admitting: Interventional Cardiology

## 2022-10-06 VITALS — BP 126/72 | HR 50 | Ht 68.0 in | Wt 186.8 lb

## 2022-10-06 DIAGNOSIS — D6869 Other thrombophilia: Secondary | ICD-10-CM

## 2022-10-06 DIAGNOSIS — I25118 Atherosclerotic heart disease of native coronary artery with other forms of angina pectoris: Secondary | ICD-10-CM

## 2022-10-06 DIAGNOSIS — R7303 Prediabetes: Secondary | ICD-10-CM | POA: Diagnosis not present

## 2022-10-06 DIAGNOSIS — I4891 Unspecified atrial fibrillation: Secondary | ICD-10-CM | POA: Diagnosis not present

## 2022-10-06 DIAGNOSIS — E782 Mixed hyperlipidemia: Secondary | ICD-10-CM

## 2022-10-06 DIAGNOSIS — Z7901 Long term (current) use of anticoagulants: Secondary | ICD-10-CM | POA: Diagnosis not present

## 2022-10-06 MED ORDER — ROSUVASTATIN CALCIUM 20 MG PO TABS: 20.0000 mg | ORAL_TABLET | Freq: Every day | ORAL | 3 refills | Status: DC

## 2022-10-06 NOTE — Patient Instructions (Addendum)
Medication Instructions:  Your physician has recommended you make the following change in your medication:  Stop Simvastatin  Start Rosuvastatin 20 mg by mouth daily   *If you need a refill on your cardiac medications before your next appointment, please call your pharmacy*   Lab Work: Your physician recommends that you return for lab work  on December 28, 2022.  CMET, Lipid and CBC.  This is fasting.  The lab opens at 7:15 AM  If you have labs (blood work) drawn today and your tests are completely normal, you will receive your results only by: [place] (if you have MyChart) OR A paper copy in the mail If you have any lab test that is abnormal or we need to change your treatment, we will call you to review the results.   Testing/Procedures: none   Follow-Up: At [facility], you and your health needs are our priority.  As part of our continuing mission to provide you with exceptional heart care, we have created designated Provider Care Teams.  These Care Teams include your primary Cardiologist (physician) and Advanced Practice Providers (APPs -  Physician Assistants and Nurse Practitioners) who all work together to provide you with the care you need, when you need it.  We recommend signing up for the patient portal called "MyChart".  Sign up information is provided on this After Visit Summary.  MyChart is used to connect with patients for Virtual Visits (Telemedicine).  Patients are able to view lab/test results, encounter notes, upcoming appointments, etc.  Non-urgent messages can be sent to your provider as well.   To learn more about what you can do with MyChart, go to [email or web address].    Your next appointment:   12 month(s)  Provider:   Larae Grooms, MD     Other Instructions

## 2022-10-13 NOTE — Addendum Note (Signed)
Addended by: Lacy Duverney R on: 10/13/2022 04:52 PM   Modules accepted: Orders

## 2022-11-01 DIAGNOSIS — G4733 Obstructive sleep apnea (adult) (pediatric): Secondary | ICD-10-CM | POA: Diagnosis not present

## 2022-11-16 DIAGNOSIS — L02212 Cutaneous abscess of back [any part, except buttock]: Secondary | ICD-10-CM | POA: Diagnosis not present

## 2022-12-28 ENCOUNTER — Ambulatory Visit: Payer: PPO | Attending: Interventional Cardiology

## 2022-12-28 DIAGNOSIS — I25118 Atherosclerotic heart disease of native coronary artery with other forms of angina pectoris: Secondary | ICD-10-CM | POA: Diagnosis not present

## 2022-12-28 DIAGNOSIS — E782 Mixed hyperlipidemia: Secondary | ICD-10-CM | POA: Diagnosis not present

## 2022-12-28 DIAGNOSIS — I4891 Unspecified atrial fibrillation: Secondary | ICD-10-CM | POA: Diagnosis not present

## 2022-12-28 DIAGNOSIS — Z7901 Long term (current) use of anticoagulants: Secondary | ICD-10-CM | POA: Diagnosis not present

## 2022-12-28 LAB — CBC

## 2022-12-29 LAB — COMPREHENSIVE METABOLIC PANEL
ALT: 14 IU/L (ref 0–44)
AST: 24 IU/L (ref 0–40)
Albumin: 4.1 g/dL (ref 3.8–4.8)
Alkaline Phosphatase: 59 IU/L (ref 44–121)
BUN/Creatinine Ratio: 15 (ref 10–24)
BUN: 17 mg/dL (ref 8–27)
Bilirubin Total: 1.2 mg/dL (ref 0.0–1.2)
CO2: 24 mmol/L (ref 20–29)
Calcium: 9 mg/dL (ref 8.6–10.2)
Chloride: 106 mmol/L (ref 96–106)
Creatinine, Ser: 1.1 mg/dL (ref 0.76–1.27)
Globulin, Total: 2.5 g/dL (ref 1.5–4.5)
Glucose: 88 mg/dL (ref 70–99)
Potassium: 4.4 mmol/L (ref 3.5–5.2)
Sodium: 140 mmol/L (ref 134–144)
Total Protein: 6.6 g/dL (ref 6.0–8.5)
eGFR: 69 mL/min/{1.73_m2} (ref >59)

## 2022-12-29 LAB — CBC
Hematocrit: 42.6 % (ref 37.5–51.0)
Hemoglobin: 14.1 g/dL (ref 13.0–17.7)
MCH: 31.1 pg (ref 26.6–33.0)
MCHC: 33.1 g/dL (ref 31.5–35.7)
MCV: 94 fL (ref 79–97)
Platelets: 128 10*3/uL — ABNORMAL LOW (ref 150–450)
RBC: 4.53 x10E6/uL (ref 4.14–5.80)
RDW: 12.2 % (ref 11.6–15.4)
WBC: 5.7 10*3/uL (ref 3.4–10.8)

## 2022-12-29 LAB — LIPID PANEL
Chol/HDL Ratio: 2.6 ratio (ref 0.0–5.0)
Cholesterol, Total: 109 mg/dL (ref 100–199)
HDL: 42 mg/dL (ref >39)
LDL Chol Calc (NIH): 53 mg/dL (ref 0–99)
Triglycerides: 62 mg/dL (ref 0–149)
VLDL Cholesterol Cal: 14 mg/dL (ref 5–40)

## 2023-02-06 DIAGNOSIS — C44629 Squamous cell carcinoma of skin of left upper limb, including shoulder: Secondary | ICD-10-CM | POA: Diagnosis not present

## 2023-02-06 DIAGNOSIS — L570 Actinic keratosis: Secondary | ICD-10-CM | POA: Diagnosis not present

## 2023-02-06 DIAGNOSIS — D492 Neoplasm of unspecified behavior of bone, soft tissue, and skin: Secondary | ICD-10-CM | POA: Diagnosis not present

## 2023-03-13 DIAGNOSIS — H11051 Peripheral pterygium, progressive, right eye: Secondary | ICD-10-CM | POA: Diagnosis not present

## 2023-03-13 DIAGNOSIS — H26492 Other secondary cataract, left eye: Secondary | ICD-10-CM | POA: Diagnosis not present

## 2023-04-11 DIAGNOSIS — G4733 Obstructive sleep apnea (adult) (pediatric): Secondary | ICD-10-CM | POA: Diagnosis not present

## 2023-05-04 DIAGNOSIS — L570 Actinic keratosis: Secondary | ICD-10-CM | POA: Diagnosis not present

## 2023-06-07 ENCOUNTER — Telehealth: Payer: Self-pay | Admitting: Interventional Cardiology

## 2023-06-07 NOTE — Telephone Encounter (Signed)
*  STAT* If patient is at the pharmacy, call can be transferred to refill team.   1. Which medications need to be refilled? (please list name of each medication and dose if known) apixaban (ELIQUIS) 5 MG TABS tablet ; metoprolol tartrate (LOPRESSOR) 25 MG tablet ; rosuvastatin (CRESTOR) 20 MG tablet    2. Would you like to learn more about the convenience, safety, & potential cost savings by using the [facility]?    3. Are you open to using the [facility] (Type [facility]. ).   4. Which pharmacy/location (including street and city if local pharmacy) is medication to be sent to? [id number] [place], [place] - [address]    5. Do they need a 30 day or 90 day supply? 90

## 2023-06-08 ENCOUNTER — Other Ambulatory Visit: Payer: Self-pay

## 2023-06-08 DIAGNOSIS — R0982 Postnasal drip: Secondary | ICD-10-CM | POA: Diagnosis not present

## 2023-06-08 DIAGNOSIS — J343 Hypertrophy of nasal turbinates: Secondary | ICD-10-CM | POA: Diagnosis not present

## 2023-06-08 DIAGNOSIS — G4733 Obstructive sleep apnea (adult) (pediatric): Secondary | ICD-10-CM | POA: Diagnosis not present

## 2023-06-08 MED ORDER — ROSUVASTATIN CALCIUM 20 MG PO TABS: 20.0000 mg | ORAL_TABLET | Freq: Every day | ORAL | 1 refills | Status: DC

## 2023-06-08 MED ORDER — METOPROLOL TARTRATE 25 MG PO TABS: 25.0000 mg | ORAL_TABLET | Freq: Every day | ORAL | 1 refills | Status: DC

## 2023-06-08 MED ORDER — APIXABAN 5 MG PO TABS: ORAL_TABLET | ORAL | 5 refills | Status: DC

## 2023-06-08 NOTE — Telephone Encounter (Signed)
Prescription refill request for Eliquis received. Indication: afib  Last office visit: 10/06/2022, Eldridge Dace Scr: 1.10, 12/28/2022 Age: 77 yo  Weight: 84.7 kg   Refill sent.

## 2023-06-08 NOTE — Telephone Encounter (Signed)
Pt is requesting Eliquis 5mg  [facility] [place]

## 2023-06-08 NOTE — Telephone Encounter (Signed)
Rx for Eliquis routed to Coumadin. Others sent to the requested Pharmacy

## 2023-06-27 ENCOUNTER — Other Ambulatory Visit: Payer: Self-pay | Admitting: Interventional Cardiology

## 2023-08-28 ENCOUNTER — Ambulatory Visit
Admission: RE | Admit: 2023-08-28 | Discharge: 2023-08-28 | Disposition: A | Discharge status: Home / Self Care | Payer: PPO | Source: Ambulatory Visit | Attending: Family Medicine | Admitting: Family Medicine

## 2023-08-28 ENCOUNTER — Other Ambulatory Visit: Payer: Self-pay | Admitting: Family Medicine

## 2023-08-28 DIAGNOSIS — M542 Cervicalgia: Secondary | ICD-10-CM

## 2023-09-21 DIAGNOSIS — R0982 Postnasal drip: Secondary | ICD-10-CM | POA: Diagnosis not present

## 2023-09-21 DIAGNOSIS — J343 Hypertrophy of nasal turbinates: Secondary | ICD-10-CM | POA: Diagnosis not present

## 2023-09-21 DIAGNOSIS — J329 Chronic sinusitis, unspecified: Secondary | ICD-10-CM | POA: Diagnosis not present

## 2023-09-21 DIAGNOSIS — H6121 Impacted cerumen, right ear: Secondary | ICD-10-CM | POA: Diagnosis not present

## 2023-09-21 DIAGNOSIS — H903 Sensorineural hearing loss, bilateral: Secondary | ICD-10-CM | POA: Diagnosis not present

## 2023-09-25 ENCOUNTER — Ambulatory Visit: Payer: PPO | Attending: Cardiology | Admitting: Cardiology

## 2023-09-25 ENCOUNTER — Other Ambulatory Visit: Payer: Self-pay

## 2023-09-25 ENCOUNTER — Encounter: Payer: Self-pay | Admitting: Cardiology

## 2023-09-25 VITALS — BP 118/82 | HR 47 | Ht 68.0 in | Wt 184.4 lb

## 2023-09-25 DIAGNOSIS — R600 Localized edema: Secondary | ICD-10-CM | POA: Diagnosis not present

## 2023-09-25 DIAGNOSIS — I4819 Other persistent atrial fibrillation: Secondary | ICD-10-CM

## 2023-09-25 MED ORDER — LISINOPRIL 10 MG PO TABS: 10.0000 mg | ORAL_TABLET | Freq: Every day | ORAL | 3 refills | Status: DC

## 2023-09-25 MED ORDER — ROSUVASTATIN CALCIUM 20 MG PO TABS: 20.0000 mg | ORAL_TABLET | Freq: Every day | ORAL | 3 refills | Status: AC

## 2023-09-25 MED ORDER — APIXABAN 5 MG PO TABS: ORAL_TABLET | ORAL | 5 refills | Status: DC

## 2023-09-25 MED ORDER — METOPROLOL TARTRATE 25 MG PO TABS: 25.0000 mg | ORAL_TABLET | ORAL | 3 refills | Status: DC | PRN

## 2023-09-25 MED ORDER — METOPROLOL TARTRATE 25 MG PO TABS: 25.0000 mg | ORAL_TABLET | Freq: Every day | ORAL | 11 refills | Status: AC | PRN

## 2023-09-25 NOTE — Progress Notes (Signed)
 Cardiology Office Note:  .   Date:  09/25/2023  ID:  Arthur Gardner, DOB 08/13/1945, MRN [id number] PCP: Noberto Retort, MD  [facility] Providers Cardiologist:  Truett Mainland, MD PCP: Noberto Retort, MD  Chief Complaint  Patient presents with   Atrial Fibrillation      History of Present Illness: .    Arthur Gardner is a 78 y.o. male with hypertension, hyperlipidemia, prediabetes, persistent Afib  Patient walks on his headache or not several times to make about 2 and half miles per day.  He denies any compressive chest pain, shortness of breath.  He has noted slight increase in blood pressure recently, but still within normal limits.  He is compliant with his medical therapy  Vitals:   09/25/23 1254  BP: 118/82  Pulse: (!) 47  SpO2: 96%     ROS:  Review of Systems  Cardiovascular:  Negative for chest pain, dyspnea on exertion, leg swelling, palpitations and syncope.        Studies Reviewed: Marland Kitchen   EKG Interpretation Date/Time:  Monday September 25 2023 12:58:02 EDT Ventricular Rate:  47 PR Interval:    QRS Duration:  112 QT Interval:  430 QTC Calculation: 380 R Axis:   -27  Text Interpretation: EKG 09/25/2023: Atrial fibrillation with slow ventricular response When compared with ECG of 14-Jan-2016 12:24, Atrial fibrillation has replaced Sinus rhythm Confirmed by Truett Mainland [phone]) on 09/25/2023 12:59:35 PM    EKG 09/25/2023: Atrial fibrillation with slow ventricular response When compared with ECG of 14-Jan-2016 12:24, Atrial fibrillation has replaced Sinus rhythm    Independently interpreted 08/2023: Chol 139, TG 68, HDL 43, LDL 82 HbA1C 5.6% Hb 15.3 Cr 1.1  Independently interpreted Echocardiogram 08/2021: EF 55 to 60%.  Indeterminate diastolic function. Mild RV dilatation, normal RV systolic function.  RVSP 35 Maji. Mild left atrial, severe right atrial dilatation. Aortic sclerosis, trivial AI. Dilation of ascending aorta 40  mm  Stress test   ECG is abnormal. ECG rhythm shows atrial fibrillation. Resting ECG shows no ST-segment deviation.   Normal blood pressure and normal heart rate response noted during stress.   Arrhythmias during stress: atrial fibrillation. ECG was interpretable and without significant changes. The ECG was not diagnostic due to pharmacologic protocol.   LV perfusion is normal. There is no evidence of ischemia. There is no evidence of infarction.   Left ventricular function is normal. Nuclear stress EF is calculated at 50 % but visually appears at least 55%.   The study is normal. The study is low risk.    Risk Assessment/Calculations:    CHA2DS2-VASc Score = 3  This indicates a 3.2% annual risk of stroke. The patient's score is based upon: CHF History: 0 HTN History: 1 Diabetes History: 0 Stroke History: 0 Vascular Disease History: 0 Age Score: 2 Gender Score: 0      Physical Exam:   Physical Exam Vitals and nursing note reviewed.  Constitutional:      General: He is not in acute distress. Neck:     Vascular: No JVD.  Cardiovascular:     Rate and Rhythm: Bradycardia present. Rhythm irregular.     Heart sounds: Normal heart sounds. No murmur heard. Pulmonary:     Effort: Pulmonary effort is normal.     Breath sounds: Normal breath sounds. No wheezing or rales.  Musculoskeletal:     Right lower leg: No edema.     Left lower leg: No edema.  VISIT DIAGNOSES:   ICD-10-CM   1. Persistent atrial fibrillation (HCC)  I48.19 EKG 12-Lead    ECHOCARDIOGRAM COMPLETE    Pro b natriuretic peptide (BNP)    Basic metabolic panel    2. Leg edema  R60.0 Pro b natriuretic peptide (BNP)    Basic metabolic panel       ASSESSMENT AND PLAN: .    Arthur Gardner is a 78 y.o. male with hypertension, hyperlipidemia, prediabetes, persistent Afib  Persistent A-fib: Asymptomatic. Ventricular rate in 40s.  Currently taking metoprolol tartrate 25 mg once daily.  I will change this  to as needed only if heart rate >100 bpm. Will check echocardiogram.  Hypertension: Well-controlled.  With stoppage of metoprolol, I will increase lisinopril from 5 mg daily to 10 mg daily. Check BMP in 1 week.  Leg edema: Most likely due to varicose veins and venous insufficiency.  Recommend compression stockings and leg elevation.  Will check proBNP.      Meds ordered this encounter  Medications   lisinopril (ZESTRIL) 10 MG tablet    Sig: Take 1 tablet (10 mg total) by mouth daily.    Dispense:  90 tablet    Refill:  3    Please schedule yearly appointment for future refills. 1st attempt. Thank you   apixaban (ELIQUIS) 5 MG TABS tablet    Sig: TAKE 1 TABLET(5 MG) BY MOUTH TWICE DAILY    Dispense:  60 tablet    Refill:  5   metoprolol tartrate (LOPRESSOR) 25 MG tablet    Sig: Take 1 tablet (25 mg total) by mouth as needed.    Dispense:  60 tablet    Refill:  3   rosuvastatin (CRESTOR) 20 MG tablet    Sig: Take 1 tablet (20 mg total) by mouth daily.    Dispense:  90 tablet    Refill:  3    Patient to stop Simvastatin     F/u in 1 year  Signed, Elder Negus, MD

## 2023-09-25 NOTE — Patient Instructions (Signed)
 Medication Instructions:  Refills sent today  CHANGE Lisinopril to 10 mg  CHANGE Metoprolol Tartrate to as needed daily    *If you need a refill on your cardiac medications before your next appointment, please call your pharmacy*   Lab Work IN 1 WEEK: BMP PROBNP  If you have labs (blood work) drawn today and your tests are completely normal, you will receive your results only by: MyChart Message (if you have MyChart) OR A paper copy in the mail If you have any lab test that is abnormal or we need to change your treatment, we will call you to review the results.   Testing/Procedures: ECHO  Your physician has requested that you have an echocardiogram. Echocardiography is a painless test that uses sound waves to create images of your heart. It provides your doctor with information about the size and shape of your heart and how well your heart's chambers and valves are working. This procedure takes approximately one hour. There are no restrictions for this procedure. Please do NOT wear cologne, perfume, aftershave, or lotions (deodorant is allowed). Please arrive 15 minutes prior to your appointment time.  Please note: We ask at that you not bring children with you during ultrasound (echo/ vascular) testing. Due to room size and safety concerns, children are not allowed in the ultrasound rooms during exams. Our front office staff cannot provide observation of children in our lobby area while testing is being conducted. An adult accompanying a patient to their appointment will only be allowed in the ultrasound room at the discretion of the ultrasound technician under special circumstances. We apologize for any inconvenience.    Follow-Up: At [facility], you and your health needs are our priority.  As part of our continuing mission to provide you with exceptional heart care, we have created designated Provider Care Teams.  These Care Teams include your primary Cardiologist  (physician) and Advanced Practice Providers (APPs -  Physician Assistants and Nurse Practitioners) who all work together to provide you with the care you need, when you need it.  We recommend signing up for the patient portal called "MyChart".  Sign up information is provided on this After Visit Summary.  MyChart is used to connect with patients for Virtual Visits (Telemedicine).  Patients are able to view lab/test results, encounter notes, upcoming appointments, etc.  Non-urgent messages can be sent to your provider as well.   To learn more about what you can do with MyChart, go to [email or web address].    Your next appointment:   1 year(s)  Provider:   Elder Negus, MD     Other Instructions       1st Floor: - Lobby - Registration  - Pharmacy  - Lab - Cafe  2nd Floor: - PV Lab - Diagnostic Testing (echo, CT, nuclear med)  3rd Floor: - Vacant  4th Floor: - TCTS (cardiothoracic surgery) - AFib Clinic - Structural Heart Clinic - Vascular Surgery  - Vascular Ultrasound  5th Floor: - HeartCare Cardiology (general and EP) - Clinical Pharmacy for coumadin, hypertension, lipid, weight-loss medications, and med management appointments    Valet parking services will be available as well.

## 2023-09-26 DIAGNOSIS — D040 Carcinoma in situ of skin of lip: Secondary | ICD-10-CM | POA: Diagnosis not present

## 2023-09-26 DIAGNOSIS — C44629 Squamous cell carcinoma of skin of left upper limb, including shoulder: Secondary | ICD-10-CM | POA: Diagnosis not present

## 2023-09-26 DIAGNOSIS — D492 Neoplasm of unspecified behavior of bone, soft tissue, and skin: Secondary | ICD-10-CM | POA: Diagnosis not present

## 2023-10-19 ENCOUNTER — Ambulatory Visit (HOSPITAL_COMMUNITY): Attending: Cardiology

## 2023-10-19 DIAGNOSIS — I4819 Other persistent atrial fibrillation: Secondary | ICD-10-CM | POA: Diagnosis not present

## 2023-10-19 LAB — ECHOCARDIOGRAM COMPLETE
Area-P 1/2: 2.59 cm2
S' Lateral: 2.8 cm

## 2023-10-20 NOTE — Progress Notes (Signed)
 Normal heart function.  Chamber vision and mildly elevated pressure on the right side of the heart related to longstanding A-fib.  No further action needed.  Thanks MJP

## 2023-10-23 ENCOUNTER — Telehealth: Payer: Self-pay | Admitting: Cardiology

## 2023-10-23 NOTE — Telephone Encounter (Signed)
 Patient is returning call for echo results. If possible, he requests a detailed voicemail if unavailable when his call is returned.

## 2023-10-24 NOTE — Telephone Encounter (Signed)
 Pt contacted and advised. Please review result note.

## 2024-01-17 DIAGNOSIS — D0462 Carcinoma in situ of skin of left upper limb, including shoulder: Secondary | ICD-10-CM | POA: Diagnosis not present

## 2024-01-31 DIAGNOSIS — H903 Sensorineural hearing loss, bilateral: Secondary | ICD-10-CM | POA: Diagnosis not present

## 2024-02-02 DIAGNOSIS — D040 Carcinoma in situ of skin of lip: Secondary | ICD-10-CM | POA: Diagnosis not present

## 2024-03-13 DIAGNOSIS — H11051 Peripheral pterygium, progressive, right eye: Secondary | ICD-10-CM | POA: Diagnosis not present

## 2024-03-19 ENCOUNTER — Other Ambulatory Visit: Payer: Self-pay | Admitting: Cardiology

## 2024-03-19 NOTE — Telephone Encounter (Signed)
 Prescription refill request for Eliquis  received. Indication:afib Last office visit:3/25 Scr:1.08  2025 Age: 78 Weight:83.6  kg  Prescription refilled

## 2024-04-09 DIAGNOSIS — L814 Other melanin hyperpigmentation: Secondary | ICD-10-CM | POA: Diagnosis not present

## 2024-04-09 DIAGNOSIS — C44329 Squamous cell carcinoma of skin of other parts of face: Secondary | ICD-10-CM | POA: Diagnosis not present

## 2024-04-09 DIAGNOSIS — L570 Actinic keratosis: Secondary | ICD-10-CM | POA: Diagnosis not present

## 2024-04-09 DIAGNOSIS — L821 Other seborrheic keratosis: Secondary | ICD-10-CM | POA: Diagnosis not present

## 2024-04-09 DIAGNOSIS — D225 Melanocytic nevi of trunk: Secondary | ICD-10-CM | POA: Diagnosis not present

## 2024-04-09 DIAGNOSIS — D492 Neoplasm of unspecified behavior of bone, soft tissue, and skin: Secondary | ICD-10-CM | POA: Diagnosis not present

## 2024-04-16 DIAGNOSIS — G4733 Obstructive sleep apnea (adult) (pediatric): Secondary | ICD-10-CM | POA: Diagnosis not present

## 2024-04-29 DIAGNOSIS — C44329 Squamous cell carcinoma of skin of other parts of face: Secondary | ICD-10-CM | POA: Diagnosis not present

## 2024-05-10 ENCOUNTER — Telehealth: Payer: Self-pay | Admitting: Radiation Oncology

## 2024-05-10 NOTE — Telephone Encounter (Signed)
 11/7 @ 10:33 am Left voicemail on patient and patient's spouse contract numbers for patient to call our office to be sch for consult.

## 2024-05-15 NOTE — Progress Notes (Signed)
 Histology and Location of Primary Skin Cancer:  Squamous Cell Carcinoma located on the Right Central Malar Cheek.  Biopsies:   Past/Anticipated interventions by patient's surgeon/dermatologist for current problematic lesion, if any:  04/29/2024 Mohs Surgery to the Right Central Malar Cheek  Past skin cancers, if any:  History of Blistering sunburns, if any: {:18581}  SAFETY ISSUES: Prior radiation? {:18581} Pacemaker/ICD? {:18581} Possible current pregnancy? {:18581} Is the patient on methotrexate? {:18581}  Current Complaints / other details:  ***

## 2024-05-16 NOTE — Progress Notes (Signed)
 Radiation Oncology         [phone] ________________________________  Initial Outpatient Consultation  Name: Arthur Gardner MRN: [id number]  Date: 05/17/2024  DOB: 21-Dec-1945  RR:[id number], Elsie JONELLE, MD  Gladis Husband, MD   REFERRING PHYSICIAN: Gladis Husband, MD  DIAGNOSIS: No diagnosis found.   Cancer Staging  No matching staging information was found for the patient.  Invasive moderately differentiate squamous cell carcinoma located on the right central malar cheek; positive for PNI in one nerve; s/p Mohs   CHIEF COMPLAINT: Here to discuss management of skin cancer  HISTORY OF PRESENT ILLNESS::Arthur Gardner is a 78 y.o. male with a history of skin cancer who presented to his dermatologist at [facility] on 04/09/24 for evaluation of a lesion located on his right central malar cheek. A skin shave biopsy of the lesion was obtained at that time and showed findings consistent with superficially invasive squamous cell carcinoma extending to the deep margin (well differentiated).  He was then [facility] for Mohs to the right malar cheek SCC. Pathology from the procedure revealed: invasive moderately differentiate squamous cell carcinoma with desmoplastic/infiltrative features; positive for PNI in one nerve, with the nerve diameter measuring 0.05 mm. Another specimen from the right central malar cheek (specimen A) also showed the same histology but was negative for PNI. Two other specimens submitted from the right malar cheek (B & C) both showed biopsy site changes with no residual SCC.     PREVIOUS RADIATION THERAPY: {EXAM; YES/NO:19492::No}  PAST MEDICAL HISTORY:  has a past medical history of Atrial flutter (HCC), Basal cell carcinoma (02/11/2015), BPH (benign prostatic hyperplasia), CAD (coronary artery disease), Hearing loss, HTN (hypertension), Hypercholesteremia, Peptic ulcer, Persistent atrial fibrillation (HCC), SCCA  (squamous cell carcinoma) of skin (12/01/2020), Sleep apnea, Squamous cell carcinoma of skin (08/14/2012), Squamous cell carcinoma of skin (10/29/2003), Squamous cell carcinoma of skin (08/02/2013), Squamous cell carcinoma of skin (10/29/2013), Squamous cell carcinoma of skin (10/22/2014), Squamous cell carcinoma of skin (03/27/2018), Squamous cell carcinoma of skin (04/23/2019), and Superficial nodular basal cell carcinoma (BCC) (12/01/2020).    PAST SURGICAL HISTORY: Past Surgical History:  Procedure Laterality Date   APPENDECTOMY     CARDIOVERSION N/A 01/14/2016   Procedure: CARDIOVERSION;  Surgeon: Vina LULLA Gull, MD;  Location: [facility] END[facility]COPY;  Service: Cardiovascular;  Laterality: N/A;   HERNIA REPAIR  2000    FAMILY HISTORY: family history includes Heart disease in his father; Hypertension in his mother.  SOCIAL HISTORY:  reports that he has quit smoking. He has never used smokeless tobacco. He reports current alcohol use. He reports that he does not use drugs.  ALLERGIES: Doxycycline hyclate, Doxycycline hyclate, and Other  MEDICATIONS:  Current Outpatient Medications  Medication Sig Dispense Refill   Coenzyme Q10 (COQ-10 PO) Take 200 mg by mouth daily.      ELIQUIS  5 MG TABS tablet TAKE 1 TABLET(5 MG) BY MOUTH TWICE DAILY 60 tablet 5   Fish Oil OIL Take 2,000 mg by mouth 2 (two) times daily.      lisinopril  (ZESTRIL ) 10 MG tablet Take 1 tablet (10 mg total) by mouth daily. 90 tablet 3   metoprolol  tartrate (LOPRESSOR ) 25 MG tablet Take 1 tablet (25 mg total) by mouth daily as needed. 30 tablet 11   rosuvastatin  (CRESTOR ) 20 MG tablet Take 1 tablet (20 mg total) by mouth daily. 90 tablet 3   No current facility-administered medications for this encounter.  REVIEW OF SYSTEMS:  Notable for that above.   PHYSICAL EXAM:  vitals were not taken for this visit.   General: Alert and oriented, in no acute distress *** HEENT: Head is normocephalic. Extraocular movements are intact.  Oropharynx is clear. Neck: Neck is supple, no palpable cervical or supraclavicular lymphadenopathy. Heart: Regular in rate and rhythm with no murmurs, rubs, or gallops. Chest: Clear to auscultation bilaterally, with no rhonchi, wheezes, or rales. Abdomen: Soft, nontender, nondistended, with no rigidity or guarding. Extremities: No cyanosis or edema. Lymphatics: see Neck Exam Skin: No concerning lesions. Musculoskeletal: symmetric strength and muscle tone throughout. Neurologic: Cranial nerves II through XII are grossly intact. No obvious focalities. Speech is fluent. Coordination is intact. Psychiatric: Judgment and insight are intact. Affect is appropriate.   ECOG = ***  0 - Asymptomatic (Fully active, able to carry on all predisease activities without restriction)  1 - Symptomatic but completely ambulatory (Restricted in physically strenuous activity but ambulatory and able to carry out work of a light or sedentary nature. For example, light housework, office work)  2 - Symptomatic, <50% in bed during the day (Ambulatory and capable of all self care but unable to carry out any work activities. Up and about more than 50% of waking hours)  3 - Symptomatic, >50% in bed, but not bedbound (Capable of only limited self-care, confined to bed or chair 50% or more of waking hours)  4 - Bedbound (Completely disabled. Cannot carry on any self-care. Totally confined to bed or chair)  5 - Death   Raylene MM, Creech RH, Tormey DC, et al. [phone]). Toxicity and response criteria of the [facility]. Am. DOROTHA Bridges. Oncol. 5 (6): 649-55   LABORATORY DATA:  Lab Results  Component Value Date   WBC 5.7 12/28/2022   HGB 14.1 12/28/2022   HCT 42.6 12/28/2022   MCV 94 12/28/2022   PLT 128 (L) 12/28/2022   CMP     Component Value Date/Time   NA 140 12/28/2022 0836   K 4.4 12/28/2022 0836   CL 106 12/28/2022 0836   CO2 24 12/28/2022 0836   GLUCOSE 88 12/28/2022 0836   GLUCOSE  89 02/15/2016 1043   BUN 17 12/28/2022 0836   CREATININE 1.10 12/28/2022 0836   CREATININE 1.01 02/15/2016 1043   CALCIUM  9.0 12/28/2022 0836   PROT 6.6 12/28/2022 0836   ALBUMIN 4.1 12/28/2022 0836   AST 24 12/28/2022 0836   ALT 14 12/28/2022 0836   ALKPHOS 59 12/28/2022 0836   BILITOT 1.2 12/28/2022 0836   GFR 71.67 03/10/2015 0829   EGFR 69 12/28/2022 0836   GFRNONAA 66 06/06/2017 1111         RADIOGRAPHY: No results found.    IMPRESSION/PLAN:***    On date of service, in total, I spent *** minutes on this encounter. Patient was seen in person.   __________________________________________   Lauraine Golden, MD  This document serves as a record of services personally performed by Lauraine Golden, MD. It was created on her behalf by Dorthy Fuse, a trained medical scribe. The creation of this record is based on the scribe's personal observations and the provider's statements to them. This document has been checked and approved by the attending provider.

## 2024-05-17 ENCOUNTER — Encounter: Payer: Self-pay | Admitting: Radiation Oncology

## 2024-05-17 ENCOUNTER — Ambulatory Visit
Admission: RE | Admit: 2024-05-17 | Discharge: 2024-05-17 | Disposition: A | Discharge status: Home / Self Care | Source: Ambulatory Visit | Attending: Radiation Oncology | Admitting: Radiation Oncology

## 2024-05-17 VITALS — BP 155/91 | HR 61 | Temp 97.8°F | Resp 20 | Ht 70.0 in | Wt 179.4 lb

## 2024-05-17 DIAGNOSIS — C44329 Squamous cell carcinoma of skin of other parts of face: Secondary | ICD-10-CM

## 2024-05-17 DIAGNOSIS — I4819 Other persistent atrial fibrillation: Secondary | ICD-10-CM | POA: Insufficient documentation

## 2024-05-17 DIAGNOSIS — I2510 Atherosclerotic heart disease of native coronary artery without angina pectoris: Secondary | ICD-10-CM | POA: Insufficient documentation

## 2024-05-17 DIAGNOSIS — N400 Enlarged prostate without lower urinary tract symptoms: Secondary | ICD-10-CM | POA: Diagnosis not present

## 2024-05-17 DIAGNOSIS — Z7901 Long term (current) use of anticoagulants: Secondary | ICD-10-CM | POA: Insufficient documentation

## 2024-05-17 DIAGNOSIS — Z79899 Other long term (current) drug therapy: Secondary | ICD-10-CM | POA: Insufficient documentation

## 2024-05-17 DIAGNOSIS — Z87891 Personal history of nicotine dependence: Secondary | ICD-10-CM | POA: Insufficient documentation

## 2024-05-17 DIAGNOSIS — Z85828 Personal history of other malignant neoplasm of skin: Secondary | ICD-10-CM | POA: Insufficient documentation

## 2024-05-17 DIAGNOSIS — H9190 Unspecified hearing loss, unspecified ear: Secondary | ICD-10-CM | POA: Insufficient documentation

## 2024-05-17 DIAGNOSIS — I10 Essential (primary) hypertension: Secondary | ICD-10-CM | POA: Diagnosis not present

## 2024-05-17 DIAGNOSIS — E7800 Pure hypercholesterolemia, unspecified: Secondary | ICD-10-CM | POA: Diagnosis not present

## 2024-05-17 DIAGNOSIS — G4730 Sleep apnea, unspecified: Secondary | ICD-10-CM | POA: Diagnosis not present

## 2024-05-17 DIAGNOSIS — Z8711 Personal history of peptic ulcer disease: Secondary | ICD-10-CM | POA: Insufficient documentation

## 2024-05-17 NOTE — Progress Notes (Signed)
 Oncology Nurse Navigator Documentation   Met with patient during initial consult with Dr. Izell.  He was accompanied by his wife, Arthur Gardner.  I introduced myself as his/their Navigator, explained my role as a member of the Care Team. Assisted with post-consult appt scheduling.       They verbalized understanding of information provided. I encouraged them to call with questions/concerns moving forward.  Delon Jefferson, RN, BSN, OCN Head & Neck Oncology Nurse Navigator [facility] [place] [phone]

## 2024-05-27 ENCOUNTER — Ambulatory Visit
Admission: RE | Admit: 2024-05-27 | Discharge: 2024-05-27 | Disposition: A | Discharge status: Home / Self Care | Source: Ambulatory Visit | Attending: Radiation Oncology | Admitting: Radiation Oncology

## 2024-05-27 DIAGNOSIS — C44329 Squamous cell carcinoma of skin of other parts of face: Secondary | ICD-10-CM | POA: Insufficient documentation

## 2024-06-10 ENCOUNTER — Other Ambulatory Visit: Payer: Self-pay

## 2024-06-10 ENCOUNTER — Ambulatory Visit
Admission: RE | Admit: 2024-06-10 | Discharge: 2024-06-10 | Disposition: A | Source: Ambulatory Visit | Attending: Radiation Oncology | Admitting: Radiation Oncology

## 2024-06-10 ENCOUNTER — Ambulatory Visit: Admission: RE | Admit: 2024-06-10 | Discharge: 2024-06-10 | Attending: Radiation Oncology

## 2024-06-10 DIAGNOSIS — C44329 Squamous cell carcinoma of skin of other parts of face: Secondary | ICD-10-CM | POA: Insufficient documentation

## 2024-06-10 LAB — RAD ONC ARIA SESSION SUMMARY
Course Elapsed Days: 0
Plan Fractions Treated to Date: 1
Plan Prescribed Dose Per Fraction: 2.5 Gy
Plan Total Fractions Prescribed: 20
Plan Total Prescribed Dose: 50 Gy
Reference Point Dosage Given to Date: 2.5 Gy
Reference Point Session Dosage Given: 2.5 Gy
Session Number: 1

## 2024-06-11 ENCOUNTER — Ambulatory Visit
Admission: RE | Admit: 2024-06-11 | Discharge: 2024-06-11 | Disposition: A | Source: Ambulatory Visit | Attending: Radiation Oncology

## 2024-06-11 ENCOUNTER — Other Ambulatory Visit: Payer: Self-pay

## 2024-06-11 DIAGNOSIS — C44329 Squamous cell carcinoma of skin of other parts of face: Secondary | ICD-10-CM | POA: Diagnosis not present

## 2024-06-11 LAB — RAD ONC ARIA SESSION SUMMARY
Course Elapsed Days: 1
Plan Fractions Treated to Date: 2
Plan Prescribed Dose Per Fraction: 2.5 Gy
Plan Total Fractions Prescribed: 20
Plan Total Prescribed Dose: 50 Gy
Reference Point Dosage Given to Date: 5 Gy
Reference Point Session Dosage Given: 2.5 Gy
Session Number: 2

## 2024-06-12 ENCOUNTER — Other Ambulatory Visit: Payer: Self-pay

## 2024-06-12 ENCOUNTER — Ambulatory Visit
Admission: RE | Admit: 2024-06-12 | Discharge: 2024-06-12 | Disposition: A | Discharge status: Home / Self Care | Source: Ambulatory Visit | Attending: Radiation Oncology | Admitting: Radiation Oncology

## 2024-06-12 DIAGNOSIS — C44329 Squamous cell carcinoma of skin of other parts of face: Secondary | ICD-10-CM | POA: Diagnosis not present

## 2024-06-12 LAB — RAD ONC ARIA SESSION SUMMARY
Course Elapsed Days: 2
Plan Fractions Treated to Date: 3
Plan Prescribed Dose Per Fraction: 2.5 Gy
Plan Total Fractions Prescribed: 20
Plan Total Prescribed Dose: 50 Gy
Reference Point Dosage Given to Date: 7.5 Gy
Reference Point Session Dosage Given: 2.5 Gy
Session Number: 3

## 2024-06-13 ENCOUNTER — Ambulatory Visit
Admission: RE | Admit: 2024-06-13 | Discharge: 2024-06-13 | Disposition: A | Discharge status: Home / Self Care | Source: Ambulatory Visit | Attending: Radiation Oncology | Admitting: Radiation Oncology

## 2024-06-13 ENCOUNTER — Other Ambulatory Visit: Payer: Self-pay

## 2024-06-13 DIAGNOSIS — C44329 Squamous cell carcinoma of skin of other parts of face: Secondary | ICD-10-CM | POA: Diagnosis not present

## 2024-06-13 LAB — RAD ONC ARIA SESSION SUMMARY
Course Elapsed Days: 3
Plan Fractions Treated to Date: 4
Plan Prescribed Dose Per Fraction: 2.5 Gy
Plan Total Fractions Prescribed: 20
Plan Total Prescribed Dose: 50 Gy
Reference Point Dosage Given to Date: 10 Gy
Reference Point Session Dosage Given: 2.5 Gy
Session Number: 4

## 2024-06-14 ENCOUNTER — Ambulatory Visit
Admission: RE | Admit: 2024-06-14 | Discharge: 2024-06-14 | Disposition: A | Source: Ambulatory Visit | Attending: Radiation Oncology

## 2024-06-14 ENCOUNTER — Other Ambulatory Visit: Payer: Self-pay

## 2024-06-14 DIAGNOSIS — C44329 Squamous cell carcinoma of skin of other parts of face: Secondary | ICD-10-CM | POA: Diagnosis not present

## 2024-06-14 LAB — RAD ONC ARIA SESSION SUMMARY
Course Elapsed Days: 4
Plan Fractions Treated to Date: 5
Plan Prescribed Dose Per Fraction: 2.5 Gy
Plan Total Fractions Prescribed: 20
Plan Total Prescribed Dose: 50 Gy
Reference Point Dosage Given to Date: 12.5 Gy
Reference Point Session Dosage Given: 2.5 Gy
Session Number: 5

## 2024-06-17 ENCOUNTER — Ambulatory Visit
Admission: RE | Admit: 2024-06-17 | Discharge: 2024-06-17 | Disposition: A | Source: Ambulatory Visit | Attending: Radiation Oncology

## 2024-06-17 ENCOUNTER — Other Ambulatory Visit: Payer: Self-pay

## 2024-06-17 DIAGNOSIS — C44329 Squamous cell carcinoma of skin of other parts of face: Secondary | ICD-10-CM | POA: Diagnosis not present

## 2024-06-17 LAB — RAD ONC ARIA SESSION SUMMARY
Course Elapsed Days: 7
Plan Fractions Treated to Date: 6
Plan Prescribed Dose Per Fraction: 2.5 Gy
Plan Total Fractions Prescribed: 20
Plan Total Prescribed Dose: 50 Gy
Reference Point Dosage Given to Date: 15 Gy
Reference Point Session Dosage Given: 2.5 Gy
Session Number: 6

## 2024-06-18 ENCOUNTER — Ambulatory Visit
Admission: RE | Admit: 2024-06-18 | Discharge: 2024-06-18 | Disposition: A | Source: Ambulatory Visit | Attending: Radiation Oncology

## 2024-06-18 ENCOUNTER — Other Ambulatory Visit: Payer: Self-pay

## 2024-06-18 DIAGNOSIS — C44329 Squamous cell carcinoma of skin of other parts of face: Secondary | ICD-10-CM | POA: Diagnosis not present

## 2024-06-18 LAB — RAD ONC ARIA SESSION SUMMARY
Course Elapsed Days: 8
Plan Fractions Treated to Date: 7
Plan Prescribed Dose Per Fraction: 2.5 Gy
Plan Total Fractions Prescribed: 20
Plan Total Prescribed Dose: 50 Gy
Reference Point Dosage Given to Date: 17.5 Gy
Reference Point Session Dosage Given: 2.5 Gy
Session Number: 7

## 2024-06-19 ENCOUNTER — Other Ambulatory Visit: Payer: Self-pay

## 2024-06-19 ENCOUNTER — Ambulatory Visit: Admission: RE | Admit: 2024-06-19 | Discharge: 2024-06-19 | Attending: Radiation Oncology

## 2024-06-19 DIAGNOSIS — C44329 Squamous cell carcinoma of skin of other parts of face: Secondary | ICD-10-CM | POA: Diagnosis not present

## 2024-06-19 LAB — RAD ONC ARIA SESSION SUMMARY
Course Elapsed Days: 9
Plan Fractions Treated to Date: 8
Plan Prescribed Dose Per Fraction: 2.5 Gy
Plan Total Fractions Prescribed: 20
Plan Total Prescribed Dose: 50 Gy
Reference Point Dosage Given to Date: 20 Gy
Reference Point Session Dosage Given: 2.5 Gy
Session Number: 8

## 2024-06-20 ENCOUNTER — Other Ambulatory Visit: Payer: Self-pay

## 2024-06-20 ENCOUNTER — Ambulatory Visit: Admission: RE | Admit: 2024-06-20

## 2024-06-20 DIAGNOSIS — C44329 Squamous cell carcinoma of skin of other parts of face: Secondary | ICD-10-CM | POA: Diagnosis not present

## 2024-06-20 LAB — RAD ONC ARIA SESSION SUMMARY
Course Elapsed Days: 10
Plan Fractions Treated to Date: 9
Plan Prescribed Dose Per Fraction: 2.5 Gy
Plan Total Fractions Prescribed: 20
Plan Total Prescribed Dose: 50 Gy
Reference Point Dosage Given to Date: 22.5 Gy
Reference Point Session Dosage Given: 2.5 Gy
Session Number: 9

## 2024-06-21 ENCOUNTER — Ambulatory Visit: Admission: RE | Admit: 2024-06-21

## 2024-06-21 ENCOUNTER — Other Ambulatory Visit: Payer: Self-pay

## 2024-06-21 DIAGNOSIS — C44329 Squamous cell carcinoma of skin of other parts of face: Secondary | ICD-10-CM | POA: Diagnosis not present

## 2024-06-21 LAB — RAD ONC ARIA SESSION SUMMARY
Course Elapsed Days: 11
Plan Fractions Treated to Date: 10
Plan Prescribed Dose Per Fraction: 2.5 Gy
Plan Total Fractions Prescribed: 20
Plan Total Prescribed Dose: 50 Gy
Reference Point Dosage Given to Date: 25 Gy
Reference Point Session Dosage Given: 2.5 Gy
Session Number: 10

## 2024-06-23 ENCOUNTER — Other Ambulatory Visit: Payer: Self-pay

## 2024-06-23 ENCOUNTER — Ambulatory Visit: Admission: RE | Admit: 2024-06-23 | Discharge: 2024-06-23 | Attending: Radiation Oncology

## 2024-06-23 DIAGNOSIS — C44329 Squamous cell carcinoma of skin of other parts of face: Secondary | ICD-10-CM | POA: Diagnosis not present

## 2024-06-23 LAB — RAD ONC ARIA SESSION SUMMARY
Course Elapsed Days: 13
Plan Fractions Treated to Date: 11
Plan Prescribed Dose Per Fraction: 2.5 Gy
Plan Total Fractions Prescribed: 20
Plan Total Prescribed Dose: 50 Gy
Reference Point Dosage Given to Date: 27.5 Gy
Reference Point Session Dosage Given: 2.5 Gy
Session Number: 11

## 2024-06-24 ENCOUNTER — Other Ambulatory Visit: Payer: Self-pay

## 2024-06-24 ENCOUNTER — Ambulatory Visit: Admission: RE | Admit: 2024-06-24 | Discharge: 2024-06-24 | Attending: Radiation Oncology

## 2024-06-24 DIAGNOSIS — C44329 Squamous cell carcinoma of skin of other parts of face: Secondary | ICD-10-CM | POA: Diagnosis not present

## 2024-06-24 LAB — RAD ONC ARIA SESSION SUMMARY
Course Elapsed Days: 14
Plan Fractions Treated to Date: 12
Plan Prescribed Dose Per Fraction: 2.5 Gy
Plan Total Fractions Prescribed: 20
Plan Total Prescribed Dose: 50 Gy
Reference Point Dosage Given to Date: 30 Gy
Reference Point Session Dosage Given: 2.5 Gy
Session Number: 12

## 2024-06-25 ENCOUNTER — Ambulatory Visit
Admission: RE | Admit: 2024-06-25 | Discharge: 2024-06-25 | Disposition: A | Discharge status: Home / Self Care | Source: Ambulatory Visit | Attending: Radiation Oncology | Admitting: Radiation Oncology

## 2024-06-25 ENCOUNTER — Other Ambulatory Visit: Payer: Self-pay

## 2024-06-25 DIAGNOSIS — C44329 Squamous cell carcinoma of skin of other parts of face: Secondary | ICD-10-CM | POA: Diagnosis not present

## 2024-06-25 LAB — RAD ONC ARIA SESSION SUMMARY
Course Elapsed Days: 15
Plan Fractions Treated to Date: 13
Plan Prescribed Dose Per Fraction: 2.5 Gy
Plan Total Fractions Prescribed: 20
Plan Total Prescribed Dose: 50 Gy
Reference Point Dosage Given to Date: 32.5 Gy
Reference Point Session Dosage Given: 2.5 Gy
Session Number: 13

## 2024-06-26 ENCOUNTER — Other Ambulatory Visit: Payer: Self-pay

## 2024-06-26 ENCOUNTER — Ambulatory Visit
Admission: RE | Admit: 2024-06-26 | Discharge: 2024-06-26 | Disposition: A | Source: Ambulatory Visit | Attending: Radiation Oncology

## 2024-06-26 DIAGNOSIS — C44329 Squamous cell carcinoma of skin of other parts of face: Secondary | ICD-10-CM | POA: Diagnosis not present

## 2024-06-26 LAB — RAD ONC ARIA SESSION SUMMARY
Course Elapsed Days: 16
Plan Fractions Treated to Date: 14
Plan Prescribed Dose Per Fraction: 2.5 Gy
Plan Total Fractions Prescribed: 20
Plan Total Prescribed Dose: 50 Gy
Reference Point Dosage Given to Date: 35 Gy
Reference Point Session Dosage Given: 2.5 Gy
Session Number: 14

## 2024-07-01 ENCOUNTER — Ambulatory Visit

## 2024-07-01 ENCOUNTER — Ambulatory Visit
Admission: RE | Admit: 2024-07-01 | Discharge: 2024-07-01 | Disposition: A | Discharge status: Home / Self Care | Source: Ambulatory Visit | Attending: Radiation Oncology | Admitting: Radiation Oncology

## 2024-07-01 ENCOUNTER — Other Ambulatory Visit: Payer: Self-pay

## 2024-07-01 DIAGNOSIS — C44329 Squamous cell carcinoma of skin of other parts of face: Secondary | ICD-10-CM | POA: Diagnosis not present

## 2024-07-01 LAB — RAD ONC ARIA SESSION SUMMARY
Course Elapsed Days: 21
Plan Fractions Treated to Date: 15
Plan Prescribed Dose Per Fraction: 2.5 Gy
Plan Total Fractions Prescribed: 20
Plan Total Prescribed Dose: 50 Gy
Reference Point Dosage Given to Date: 37.5 Gy
Reference Point Session Dosage Given: 2.5 Gy
Session Number: 15

## 2024-07-02 ENCOUNTER — Other Ambulatory Visit: Payer: Self-pay

## 2024-07-02 ENCOUNTER — Ambulatory Visit
Admission: RE | Admit: 2024-07-02 | Discharge: 2024-07-02 | Disposition: A | Discharge status: Home / Self Care | Source: Ambulatory Visit | Attending: Radiation Oncology | Admitting: Radiation Oncology

## 2024-07-02 DIAGNOSIS — C44329 Squamous cell carcinoma of skin of other parts of face: Secondary | ICD-10-CM | POA: Diagnosis not present

## 2024-07-02 LAB — RAD ONC ARIA SESSION SUMMARY
Course Elapsed Days: 22
Plan Fractions Treated to Date: 16
Plan Prescribed Dose Per Fraction: 2.5 Gy
Plan Total Fractions Prescribed: 20
Plan Total Prescribed Dose: 50 Gy
Reference Point Dosage Given to Date: 40 Gy
Reference Point Session Dosage Given: 2.5 Gy
Session Number: 16

## 2024-07-03 ENCOUNTER — Other Ambulatory Visit: Payer: Self-pay

## 2024-07-03 ENCOUNTER — Ambulatory Visit
Admission: RE | Admit: 2024-07-03 | Discharge: 2024-07-03 | Disposition: A | Discharge status: Home / Self Care | Source: Ambulatory Visit | Attending: Radiation Oncology | Admitting: Radiation Oncology

## 2024-07-03 DIAGNOSIS — C44329 Squamous cell carcinoma of skin of other parts of face: Secondary | ICD-10-CM | POA: Diagnosis not present

## 2024-07-03 LAB — RAD ONC ARIA SESSION SUMMARY
Course Elapsed Days: 23
Plan Fractions Treated to Date: 17
Plan Prescribed Dose Per Fraction: 2.5 Gy
Plan Total Fractions Prescribed: 20
Plan Total Prescribed Dose: 50 Gy
Reference Point Dosage Given to Date: 42.5 Gy
Reference Point Session Dosage Given: 2.5 Gy
Session Number: 17

## 2024-07-05 ENCOUNTER — Other Ambulatory Visit: Payer: Self-pay

## 2024-07-05 ENCOUNTER — Ambulatory Visit
Admission: RE | Admit: 2024-07-05 | Discharge: 2024-07-05 | Disposition: A | Discharge status: Home / Self Care | Source: Ambulatory Visit | Attending: Radiation Oncology | Admitting: Radiation Oncology

## 2024-07-05 ENCOUNTER — Telehealth: Payer: Self-pay | Admitting: Cardiology

## 2024-07-05 DIAGNOSIS — C44329 Squamous cell carcinoma of skin of other parts of face: Secondary | ICD-10-CM | POA: Insufficient documentation

## 2024-07-05 LAB — RAD ONC ARIA SESSION SUMMARY
Course Elapsed Days: 25
Plan Fractions Treated to Date: 18
Plan Prescribed Dose Per Fraction: 2.5 Gy
Plan Total Fractions Prescribed: 20
Plan Total Prescribed Dose: 50 Gy
Reference Point Dosage Given to Date: 45 Gy
Reference Point Session Dosage Given: 2.5 Gy
Session Number: 18

## 2024-07-05 NOTE — Telephone Encounter (Signed)
" °*  STAT* If patient is at the pharmacy, call can be transferred to refill team.   1. Which medications need to be refilled? (please list name of each medication and dose if known)   rosuvastatin  (CRESTOR ) 20 MG tablet  ELIQUIS  5 MG TABS tablet   lisinopril  (ZESTRIL ) 10 MG tablet    2. Would you like to learn more about the convenience, safety, & potential cost savings by using the [facility]? No    3. Are you open to using the [facility] (Type [facility]. No    4. Which pharmacy/location (including street and city if local pharmacy) is medication to be sent to? [id number] [place] [place] - [address] AT [address] OF [address] [address]     5. Do they need a 30 day or 90 day supply? 90 day  30 day Eliquis    "

## 2024-07-08 ENCOUNTER — Ambulatory Visit
Admission: RE | Admit: 2024-07-08 | Discharge: 2024-07-08 | Disposition: A | Discharge status: Home / Self Care | Source: Ambulatory Visit | Attending: Radiation Oncology | Admitting: Radiation Oncology

## 2024-07-08 ENCOUNTER — Other Ambulatory Visit: Payer: Self-pay

## 2024-07-08 DIAGNOSIS — C44329 Squamous cell carcinoma of skin of other parts of face: Secondary | ICD-10-CM | POA: Diagnosis not present

## 2024-07-08 LAB — RAD ONC ARIA SESSION SUMMARY
Course Elapsed Days: 28
Plan Fractions Treated to Date: 19
Plan Prescribed Dose Per Fraction: 2.5 Gy
Plan Total Fractions Prescribed: 20
Plan Total Prescribed Dose: 50 Gy
Reference Point Dosage Given to Date: 47.5 Gy
Reference Point Session Dosage Given: 2.5 Gy
Session Number: 19

## 2024-07-09 ENCOUNTER — Other Ambulatory Visit: Payer: Self-pay

## 2024-07-09 ENCOUNTER — Ambulatory Visit
Admission: RE | Admit: 2024-07-09 | Discharge: 2024-07-09 | Disposition: A | Source: Ambulatory Visit | Attending: Radiation Oncology

## 2024-07-09 ENCOUNTER — Ambulatory Visit

## 2024-07-09 DIAGNOSIS — C44329 Squamous cell carcinoma of skin of other parts of face: Secondary | ICD-10-CM | POA: Diagnosis not present

## 2024-07-09 LAB — RAD ONC ARIA SESSION SUMMARY
Course Elapsed Days: 29
Plan Fractions Treated to Date: 20
Plan Prescribed Dose Per Fraction: 2.5 Gy
Plan Total Fractions Prescribed: 20
Plan Total Prescribed Dose: 50 Gy
Reference Point Dosage Given to Date: 50 Gy
Reference Point Session Dosage Given: 2.5 Gy
Session Number: 20

## 2024-07-09 NOTE — Telephone Encounter (Signed)
 Returned call to pt to get some clarification.  Left pt a message that a refill was sent in September 2025 to [facility] and should last until March, 2026.    I asked pt to call back to clarify if he still needs refills, as all of his medications should be good until his appointment in March with Dr. Elmira.

## 2024-07-10 ENCOUNTER — Ambulatory Visit

## 2024-07-10 NOTE — Telephone Encounter (Signed)
 Pt calling to let us  know he is switching pharmacies.

## 2024-07-10 NOTE — Telephone Encounter (Signed)
 Patient was returning call. Please advise ?

## 2024-07-10 NOTE — Radiation Completion Notes (Signed)
 Patient Name: Arthur Gardner, Arthur Gardner MRN: [id number] Date of Birth: 12-29-1945 Referring Physician: SAMULE LUNGER, M.D. Date of Service: 2024-07-10 Radiation Oncologist: Lauraine Golden, M.D. [facility]                             RADIATION ONCOLOGY END OF TREATMENT NOTE     Diagnosis: C44.329 Squamous cell carcinoma of skin of other parts of face Staging on 2024-05-17: Squamous cell carcinoma of skin of right cheek T=pT3, N=pN0, M=cM0 Intent: Curative     ==========DELIVERED PLANS==========  First Treatment Date: 2024-06-10 Last Treatment Date: 2024-07-09   Plan Name: HN_R_Cheek Site: Cheek, Right Technique: Electron Mode: Electron Dose Per Fraction: 2.5 Gy Prescribed Dose (Delivered / Prescribed): 50 Gy / 50 Gy Prescribed Fxs (Delivered / Prescribed): 20 / 20     ==========ON TREATMENT VISIT DATES========== 2024-06-10, 2024-06-17, 2024-06-24, 2024-07-08     ==========UPCOMING VISITS========== 09/19/2024 [facility] OFFICE VISIT Elmira Newman PARAS, MD  08/26/2024 [facility]C FOLLOW UP 20 Golden Lauraine, MD        ==========APPENDIX - ON TREATMENT VISIT NOTES==========   See weekly On Treatment Notes in Epic for details in the Media tab (listed as Progress notes on the On Treatment Visit Dates listed above).

## 2024-07-11 ENCOUNTER — Other Ambulatory Visit: Payer: Self-pay | Admitting: Cardiology

## 2024-08-26 ENCOUNTER — Ambulatory Visit: Admitting: Radiation Oncology

## 2024-09-19 ENCOUNTER — Ambulatory Visit: Admitting: Cardiology
